# Patient Record
Sex: Female | Born: 1950 | Race: White | Hispanic: No | Smoking: Never smoker
Health system: Southern US, Community
[De-identification: ages and names within clinical notes are randomized; demographics above are authoritative.]

## PROBLEM LIST (undated history)

## (undated) DIAGNOSIS — C801 Malignant (primary) neoplasm, unspecified: Secondary | ICD-10-CM

## (undated) DIAGNOSIS — N8110 Cystocele, unspecified: Secondary | ICD-10-CM

## (undated) DIAGNOSIS — K219 Gastro-esophageal reflux disease without esophagitis: Secondary | ICD-10-CM

## (undated) DIAGNOSIS — M810 Age-related osteoporosis without current pathological fracture: Secondary | ICD-10-CM

## (undated) HISTORY — DX: Age-related osteoporosis without current pathological fracture: M81.0

## (undated) HISTORY — DX: Malignant (primary) neoplasm, unspecified: C80.1

## (undated) HISTORY — PX: COLON RESECTION: SHX5231

## (undated) HISTORY — DX: Gastro-esophageal reflux disease without esophagitis: K21.9

## (undated) HISTORY — PX: APPENDECTOMY: SHX54

## (undated) HISTORY — PX: ABDOMINAL HYSTERECTOMY: SHX81

## (undated) HISTORY — PX: CHOLECYSTECTOMY: SHX55

---

## 2010-07-24 HISTORY — PX: PANCREATICODUODENECTOMY: SUR1000

## 2013-03-17 ENCOUNTER — Encounter: Payer: Self-pay | Admitting: Sports Medicine

## 2013-04-12 ENCOUNTER — Encounter: Payer: Self-pay | Admitting: Sports Medicine

## 2013-05-12 ENCOUNTER — Encounter: Payer: Self-pay | Admitting: Sports Medicine

## 2013-05-12 ENCOUNTER — Ambulatory Visit (INDEPENDENT_AMBULATORY_CARE_PROVIDER_SITE_OTHER): Payer: BC Managed Care – PPO | Admitting: Sports Medicine

## 2013-05-12 VITALS — BP 104/69 | Ht 65.0 in | Wt 120.0 lb

## 2013-05-12 DIAGNOSIS — M21619 Bunion of unspecified foot: Secondary | ICD-10-CM

## 2013-05-12 DIAGNOSIS — M21611 Bunion of right foot: Secondary | ICD-10-CM | POA: Insufficient documentation

## 2013-05-12 DIAGNOSIS — M216X9 Other acquired deformities of unspecified foot: Secondary | ICD-10-CM | POA: Insufficient documentation

## 2013-05-12 NOTE — Progress Notes (Signed)
Patient ID: Belinda Nielsen, female   DOB: 01-08-1951, 62 y.o.   MRN: [id number] 62 yo female presents for evaluation of orthotics and need of new pair as her prior orthotics have worn out.  History of arch pain with walking and excessive pronation which was corrected with custom orthotics.  These have become worn and are not functioning as well as in past.  PMH/PSH:  Noncontributory  NKDA  SH:  Non smoker  ROS:  As per HPI, otherwise negative.  Examination: BP 104/69  Ht 5\' 5"  (1.651 m)  Wt 120 lb (54.432 kg)  BMI 19.97 kg/m2 Well developed/well nourished 62 white female A&Ox3  Feet: Midfoot breakdown with arch collapse and mild pronation. Left foot with splaying between medial and lateral column Right medial column breakdown Bunion deformity right with mild on left.  Patient was fitted for a :  semi-rigid orthotic. The orthotic was heated and afterward the patient stood on the orthotic blank positioned on the orthotic stand. The patient was positioned in subtalar neutral position and 10 degrees of ankle dorsiflexion in a weight bearing stance. After completion of molding, a stable base was applied to the orthotic blank. The blank was ground to a stable position for weight bearing. Size:  8 Base:  EVA Posting:  None Additional orthotic padding:  1st MCP joint bilaterally.  Greater than 40 minutes of time spent obtaining history, examination and production of orthotics.

## 2013-11-05 ENCOUNTER — Encounter (HOSPITAL_COMMUNITY): Payer: Self-pay | Admitting: Emergency Medicine

## 2013-11-05 ENCOUNTER — Emergency Department (HOSPITAL_COMMUNITY): Payer: BC Managed Care – PPO

## 2013-11-05 ENCOUNTER — Inpatient Hospital Stay (HOSPITAL_COMMUNITY)
Admission: EM | Admit: 2013-11-05 | Discharge: 2013-11-12 | DRG: 327 | Disposition: A | Discharge status: Home / Self Care | Payer: BC Managed Care – PPO | Attending: Surgery | Admitting: Surgery

## 2013-11-05 ENCOUNTER — Encounter (HOSPITAL_COMMUNITY): Admission: EM | Disposition: A | Discharge status: Home / Self Care | Payer: Self-pay | Source: Home / Self Care

## 2013-11-05 ENCOUNTER — Emergency Department (HOSPITAL_COMMUNITY): Payer: BC Managed Care – PPO | Admitting: Certified Registered"

## 2013-11-05 ENCOUNTER — Encounter (HOSPITAL_COMMUNITY): Payer: BC Managed Care – PPO | Admitting: Certified Registered"

## 2013-11-05 DIAGNOSIS — K560 Paralytic ileus: Secondary | ICD-10-CM | POA: Diagnosis not present

## 2013-11-05 DIAGNOSIS — K9423 Gastrostomy malfunction: Principal | ICD-10-CM | POA: Diagnosis present

## 2013-11-05 DIAGNOSIS — R198 Other specified symptoms and signs involving the digestive system and abdomen: Secondary | ICD-10-CM | POA: Diagnosis present

## 2013-11-05 DIAGNOSIS — K559 Vascular disorder of intestine, unspecified: Secondary | ICD-10-CM

## 2013-11-05 DIAGNOSIS — K289 Gastrojejunal ulcer, unspecified as acute or chronic, without hemorrhage or perforation: Secondary | ICD-10-CM | POA: Diagnosis present

## 2013-11-05 DIAGNOSIS — R109 Unspecified abdominal pain: Secondary | ICD-10-CM

## 2013-11-05 DIAGNOSIS — K668 Other specified disorders of peritoneum: Secondary | ICD-10-CM

## 2013-11-05 DIAGNOSIS — K660 Peritoneal adhesions (postprocedural) (postinfection): Secondary | ICD-10-CM | POA: Diagnosis present

## 2013-11-05 DIAGNOSIS — Y838 Other surgical procedures as the cause of abnormal reaction of the patient, or of later complication, without mention of misadventure at the time of the procedure: Secondary | ICD-10-CM | POA: Diagnosis present

## 2013-11-05 DIAGNOSIS — IMO0002 Reserved for concepts with insufficient information to code with codable children: Secondary | ICD-10-CM | POA: Diagnosis present

## 2013-11-05 DIAGNOSIS — Z8509 Personal history of malignant neoplasm of other digestive organs: Secondary | ICD-10-CM

## 2013-11-05 DIAGNOSIS — Z9049 Acquired absence of other specified parts of digestive tract: Secondary | ICD-10-CM

## 2013-11-05 DIAGNOSIS — R188 Other ascites: Secondary | ICD-10-CM | POA: Diagnosis present

## 2013-11-05 DIAGNOSIS — Z9089 Acquired absence of other organs: Secondary | ICD-10-CM

## 2013-11-05 DIAGNOSIS — K9429 Other complications of gastrostomy: Secondary | ICD-10-CM | POA: Diagnosis present

## 2013-11-05 DIAGNOSIS — Z980 Intestinal bypass and anastomosis status: Secondary | ICD-10-CM

## 2013-11-05 DIAGNOSIS — D3A8 Other benign neuroendocrine tumors: Secondary | ICD-10-CM | POA: Diagnosis present

## 2013-11-05 DIAGNOSIS — K929 Disease of digestive system, unspecified: Secondary | ICD-10-CM | POA: Diagnosis not present

## 2013-11-05 DIAGNOSIS — E876 Hypokalemia: Secondary | ICD-10-CM | POA: Diagnosis present

## 2013-11-05 DIAGNOSIS — K631 Perforation of intestine (nontraumatic): Secondary | ICD-10-CM

## 2013-11-05 DIAGNOSIS — Z90411 Acquired partial absence of pancreas: Secondary | ICD-10-CM

## 2013-11-05 HISTORY — DX: Cystocele, unspecified: N81.10

## 2013-11-05 HISTORY — PX: LAPAROTOMY: SHX154

## 2013-11-05 HISTORY — PX: LYSIS OF ADHESION: SHX5961

## 2013-11-05 LAB — CBC WITH DIFFERENTIAL/PLATELET
BASOS PCT: 0 % (ref 0–1)
Basophils Absolute: 0 10*3/uL (ref 0.0–0.1)
EOS ABS: 0 10*3/uL (ref 0.0–0.7)
EOS PCT: 0 % (ref 0–5)
HCT: 44.1 % (ref 36.0–46.0)
HEMOGLOBIN: 14.8 g/dL (ref 12.0–15.0)
Lymphocytes Relative: 5 % — ABNORMAL LOW (ref 12–46)
Lymphs Abs: 1 10*3/uL (ref 0.7–4.0)
MCH: 29.4 pg (ref 26.0–34.0)
MCHC: 33.6 g/dL (ref 30.0–36.0)
MCV: 87.5 fL (ref 78.0–100.0)
Monocytes Absolute: 0.7 10*3/uL (ref 0.1–1.0)
Monocytes Relative: 4 % (ref 3–12)
NEUTROS PCT: 91 % — AB (ref 43–77)
Neutro Abs: 16.8 10*3/uL — ABNORMAL HIGH (ref 1.7–7.7)
Platelets: 351 10*3/uL (ref 150–400)
RBC: 5.04 MIL/uL (ref 3.87–5.11)
RDW: 13 % (ref 11.5–15.5)
WBC: 18.5 10*3/uL — ABNORMAL HIGH (ref 4.0–10.5)

## 2013-11-05 LAB — COMPREHENSIVE METABOLIC PANEL
ALBUMIN: 3.5 g/dL (ref 3.5–5.2)
ALT: 21 U/L (ref 0–35)
AST: 18 U/L (ref 0–37)
Alkaline Phosphatase: 78 U/L (ref 39–117)
BUN: 8 mg/dL (ref 6–23)
CALCIUM: 8.8 mg/dL (ref 8.4–10.5)
CO2: 24 mEq/L (ref 19–32)
CREATININE: 0.44 mg/dL — AB (ref 0.50–1.10)
Chloride: 102 mEq/L (ref 96–112)
GFR calc non Af Amer: >90 mL/min (ref >90)
Glucose, Bld: 154 mg/dL — ABNORMAL HIGH (ref 70–99)
Potassium: 3.2 mEq/L — ABNORMAL LOW (ref 3.7–5.3)
Sodium: 141 mEq/L (ref 137–147)
Total Bilirubin: 0.4 mg/dL (ref 0.3–1.2)
Total Protein: 6.2 g/dL (ref 6.0–8.3)

## 2013-11-05 LAB — URINALYSIS, ROUTINE W REFLEX MICROSCOPIC
Bilirubin Urine: NEGATIVE
Glucose, UA: NEGATIVE mg/dL
Hgb urine dipstick: NEGATIVE
Ketones, ur: 40 mg/dL — AB
LEUKOCYTES UA: NEGATIVE
Nitrite: NEGATIVE
PH: 6 (ref 5.0–8.0)
Protein, ur: NEGATIVE mg/dL
Specific Gravity, Urine: 1.026 (ref 1.005–1.030)
Urobilinogen, UA: 0.2 mg/dL (ref 0.0–1.0)

## 2013-11-05 LAB — LACTIC ACID, PLASMA: Lactic Acid, Venous: 1.5 mmol/L (ref 0.5–2.2)

## 2013-11-05 LAB — TROPONIN I

## 2013-11-05 LAB — LIPASE, BLOOD: Lipase: 17 U/L (ref 11–59)

## 2013-11-05 SURGERY — LAPAROTOMY, EXPLORATORY
Anesthesia: General

## 2013-11-05 MED ORDER — HYDROMORPHONE HCL PF 1 MG/ML IJ SOLN
1.0000 mg | Freq: Once | INTRAMUSCULAR | Status: AC
  Administered 2013-11-05: 1 mg via INTRAVENOUS
  Filled 2013-11-05: qty 1

## 2013-11-05 MED ORDER — SODIUM CHLORIDE 0.9 % IR SOLN
Status: DC | PRN
  Administered 2013-11-05 (×4): 1000 mL

## 2013-11-05 MED ORDER — ROCURONIUM BROMIDE 100 MG/10ML IV SOLN
INTRAVENOUS | Status: AC
  Filled 2013-11-05: qty 1

## 2013-11-05 MED ORDER — SUCCINYLCHOLINE CHLORIDE 20 MG/ML IJ SOLN
INTRAMUSCULAR | Status: DC | PRN
  Administered 2013-11-05: 80 mg via INTRAVENOUS

## 2013-11-05 MED ORDER — SODIUM CHLORIDE 0.9 % IV BOLUS (SEPSIS)
1000.0000 mL | Freq: Once | INTRAVENOUS | Status: AC
  Administered 2013-11-05: 1000 mL via INTRAVENOUS

## 2013-11-05 MED ORDER — ACETAMINOPHEN 10 MG/ML IV SOLN
INTRAVENOUS | Status: DC | PRN
  Administered 2013-11-05: 1000 mg via INTRAVENOUS

## 2013-11-05 MED ORDER — FENTANYL CITRATE 0.05 MG/ML IJ SOLN
INTRAMUSCULAR | Status: AC
  Filled 2013-11-05: qty 5

## 2013-11-05 MED ORDER — MIDAZOLAM HCL 5 MG/5ML IJ SOLN
INTRAMUSCULAR | Status: DC | PRN
  Administered 2013-11-05: 2 mg via INTRAVENOUS

## 2013-11-05 MED ORDER — SCOPOLAMINE 1 MG/3DAYS TD PT72
MEDICATED_PATCH | TRANSDERMAL | Status: AC
  Filled 2013-11-05: qty 1

## 2013-11-05 MED ORDER — SCOPOLAMINE 1 MG/3DAYS TD PT72
MEDICATED_PATCH | TRANSDERMAL | Status: DC | PRN
  Administered 2013-11-05: 1 via TRANSDERMAL

## 2013-11-05 MED ORDER — PROPOFOL 10 MG/ML IV BOLUS
INTRAVENOUS | Status: AC
  Filled 2013-11-05: qty 20

## 2013-11-05 MED ORDER — ONDANSETRON HCL 4 MG/2ML IJ SOLN
INTRAMUSCULAR | Status: AC
  Filled 2013-11-05: qty 2

## 2013-11-05 MED ORDER — ONDANSETRON HCL 4 MG/2ML IJ SOLN
INTRAMUSCULAR | Status: DC | PRN
  Administered 2013-11-05: 4 mg via INTRAVENOUS

## 2013-11-05 MED ORDER — MIDAZOLAM HCL 2 MG/2ML IJ SOLN
INTRAMUSCULAR | Status: AC
  Filled 2013-11-05: qty 2

## 2013-11-05 MED ORDER — DEXAMETHASONE SODIUM PHOSPHATE 10 MG/ML IJ SOLN
INTRAMUSCULAR | Status: DC | PRN
  Administered 2013-11-05: 10 mg via INTRAVENOUS

## 2013-11-05 MED ORDER — DEXAMETHASONE SODIUM PHOSPHATE 10 MG/ML IJ SOLN
INTRAMUSCULAR | Status: AC
  Filled 2013-11-05: qty 1

## 2013-11-05 MED ORDER — LIDOCAINE HCL (PF) 2 % IJ SOLN
INTRAMUSCULAR | Status: DC | PRN
  Administered 2013-11-05: 40 mg via INTRADERMAL

## 2013-11-05 MED ORDER — SODIUM CHLORIDE 0.9 % IV SOLN
1.0000 g | INTRAVENOUS | Status: AC
  Administered 2013-11-05: 1 g via INTRAVENOUS
  Filled 2013-11-05: qty 1

## 2013-11-05 MED ORDER — IOHEXOL 300 MG/ML  SOLN
100.0000 mL | Freq: Once | INTRAMUSCULAR | Status: AC | PRN
  Administered 2013-11-05: 100 mL via INTRAVENOUS

## 2013-11-05 MED ORDER — FENTANYL CITRATE 0.05 MG/ML IJ SOLN
INTRAMUSCULAR | Status: DC | PRN
  Administered 2013-11-05: 100 ug via INTRAVENOUS
  Administered 2013-11-05 (×3): 50 ug via INTRAVENOUS

## 2013-11-05 MED ORDER — ACETAMINOPHEN 10 MG/ML IV SOLN
1000.0000 mg | Freq: Four times a day (QID) | INTRAVENOUS | Status: DC
Start: 2013-11-06 — End: 2013-11-06
  Filled 2013-11-05: qty 100

## 2013-11-05 MED ORDER — HYDROMORPHONE HCL PF 2 MG/ML IJ SOLN
INTRAMUSCULAR | Status: AC
  Filled 2013-11-05: qty 1

## 2013-11-05 MED ORDER — ERTAPENEM SODIUM 1 G IJ SOLR
INTRAMUSCULAR | Status: AC
  Filled 2013-11-05: qty 1

## 2013-11-05 MED ORDER — IOHEXOL 300 MG/ML  SOLN
50.0000 mL | Freq: Once | INTRAMUSCULAR | Status: AC | PRN
  Administered 2013-11-05: 50 mL via ORAL

## 2013-11-05 MED ORDER — ONDANSETRON HCL 4 MG/2ML IJ SOLN
4.0000 mg | Freq: Once | INTRAMUSCULAR | Status: AC
  Administered 2013-11-05: 4 mg via INTRAVENOUS
  Filled 2013-11-05: qty 2

## 2013-11-05 MED ORDER — PROPOFOL 10 MG/ML IV BOLUS
INTRAVENOUS | Status: DC | PRN
  Administered 2013-11-05: 100 mg via INTRAVENOUS

## 2013-11-05 MED ORDER — ROCURONIUM BROMIDE 100 MG/10ML IV SOLN
INTRAVENOUS | Status: DC | PRN
Start: 2013-11-05 — End: 2013-11-06
  Administered 2013-11-05 (×2): 10 mg via INTRAVENOUS
  Administered 2013-11-05: 20 mg via INTRAVENOUS

## 2013-11-05 MED ORDER — LACTATED RINGERS IV SOLN
INTRAVENOUS | Status: DC | PRN
  Administered 2013-11-05: 22:00:00 via INTRAVENOUS

## 2013-11-05 MED ORDER — HYDROMORPHONE HCL PF 1 MG/ML IJ SOLN
INTRAMUSCULAR | Status: DC | PRN
  Administered 2013-11-05 – 2013-11-06 (×2): 1 mg via INTRAVENOUS

## 2013-11-05 SURGICAL SUPPLY — 43 items
APPLICATOR COTTON TIP 6IN STRL (MISCELLANEOUS) ×2 IMPLANT
BLADE EXTENDED COATED 6.5IN (ELECTRODE) IMPLANT
BLADE HEX COATED 2.75 (ELECTRODE) ×2 IMPLANT
CANISTER SUCTION 2500CC (MISCELLANEOUS) ×2 IMPLANT
COVER MAYO STAND STRL (DRAPES) ×2 IMPLANT
DRAPE LAPAROSCOPIC ABDOMINAL (DRAPES) ×2 IMPLANT
DRAPE WARM FLUID 44X44 (DRAPE) ×2 IMPLANT
ELECT REM PT RETURN 9FT ADLT (ELECTROSURGICAL) ×2
ELECTRODE REM PT RTRN 9FT ADLT (ELECTROSURGICAL) ×1 IMPLANT
GLOVE BIOGEL PI IND STRL 7.0 (GLOVE) ×2 IMPLANT
GLOVE BIOGEL PI IND STRL 8.5 (GLOVE) ×2 IMPLANT
GLOVE BIOGEL PI INDICATOR 7.0 (GLOVE) ×2
GLOVE BIOGEL PI INDICATOR 8.5 (GLOVE) ×2
GLOVE SURG ORTHO 8.0 STRL STRW (GLOVE) ×2 IMPLANT
GOWN STRL REUS W/TWL LRG LVL3 (GOWN DISPOSABLE) ×2 IMPLANT
GOWN STRL REUS W/TWL XL LVL3 (GOWN DISPOSABLE) ×4 IMPLANT
KIT BASIN OR (CUSTOM PROCEDURE TRAY) ×2 IMPLANT
LIGASURE IMPACT 36 18CM CVD LR (INSTRUMENTS) ×2 IMPLANT
NS IRRIG 1000ML POUR BTL (IV SOLUTION) ×2 IMPLANT
PACK GENERAL/GYN (CUSTOM PROCEDURE TRAY) ×2 IMPLANT
RELOAD PROXIMATE 75MM BLUE (ENDOMECHANICALS) ×6 IMPLANT
RELOAD PROXIMATE TA60MM GREEN (ENDOMECHANICALS) ×4 IMPLANT
SPONGE GAUZE 4X4 12PLY (GAUZE/BANDAGES/DRESSINGS) ×2 IMPLANT
SPONGE LAP 18X18 X RAY DECT (DISPOSABLE) ×2 IMPLANT
STAPLER GUN LINEAR PROX 60 (STAPLE) ×2 IMPLANT
STAPLER PROXIMATE 75MM BLUE (STAPLE) ×2 IMPLANT
STAPLER VISISTAT 35W (STAPLE) ×4 IMPLANT
SUCTION POOLE TIP (SUCTIONS) ×2 IMPLANT
SUT NOV 1 T60/GS (SUTURE) IMPLANT
SUT NOVA 0 T19/GS 22DT (SUTURE) ×4 IMPLANT
SUT SILK 2 0 (SUTURE) ×1
SUT SILK 2 0 SH CR/8 (SUTURE) ×6 IMPLANT
SUT SILK 2-0 18XBRD TIE 12 (SUTURE) ×1 IMPLANT
SUT SILK 3 0 (SUTURE) ×1
SUT SILK 3 0 SH CR/8 (SUTURE) ×2 IMPLANT
SUT SILK 3-0 18XBRD TIE 12 (SUTURE) ×1 IMPLANT
SUT VICRYL 2 0 18  UND BR (SUTURE)
SUT VICRYL 2 0 18 UND BR (SUTURE) IMPLANT
TAPE CLOTH SURG 4X10 WHT LF (GAUZE/BANDAGES/DRESSINGS) ×2 IMPLANT
TOWEL OR 17X26 10 PK STRL BLUE (TOWEL DISPOSABLE) ×4 IMPLANT
TRAY FOLEY CATH 14FRSI W/METER (CATHETERS) ×2 IMPLANT
WATER STERILE IRR 1500ML POUR (IV SOLUTION) ×2 IMPLANT
YANKAUER SUCT BULB TIP NO VENT (SUCTIONS) IMPLANT

## 2013-11-05 NOTE — ED Notes (Signed)
Per EMS: Generalized abd pain onset 2 hrs ago. Hx of prolapsed bladder. Pt states pain is sharp, 10/10, and feels like gas. Pt says pain feels like pain she's had in the past but worse. Hx of pancreatic cancer, had surgery for removal. Denies N/V/D. Pt appears diaphoretic and pale. A&O x 4. Pt ambulatory at baseline, couldn't walk today d/t pain. 50 mcg Fentanyl given IM.

## 2013-11-05 NOTE — ED Notes (Signed)
Couldn't obtain oral temp x2. Stated we would have to get a rectal temp and Pt refused.

## 2013-11-05 NOTE — ED Provider Notes (Signed)
Medical screening examination/treatment/procedure(s) were conducted as a shared visit with non-physician practitioner(s) and myself.  I personally evaluated the patient during the encounter.   EKG Interpretation   Date/Time:  Saturday Nov 05 2013 18:56:44 EDT Ventricular Rate:  80 PR Interval:  179 QRS Duration: 100 QT Interval:  416 QTC Calculation: 480 R Axis:   30 Text Interpretation:  Sinus rhythm No previous ECGs available Confirmed by  YAO  MD, DAVID ([id number]) on 11/05/2013 6:59:09 PM     Acute pain with generalized peritonitis on exam.  Babette Relic, MD 11/09/13 2208

## 2013-11-05 NOTE — ED Notes (Signed)
Asked Pt to void and she said she was unable to. I made her aware that we would have to I/O her if she couldn't in about 30 mins.

## 2013-11-05 NOTE — ED Provider Notes (Signed)
CSN: [id number]     Arrival date & time 11/05/13  1625 History   First MD Initiated Contact with Patient 11/05/13 1628     Chief Complaint  Patient presents with  . Abdominal Pain     (Consider location/radiation/quality/duration/timing/severity/associated sxs/prior Treatment) HPI Comments: Patient presents to the ED with a chief complain of abdominal pain.  She states that the pain started this morning.  She states that she has had pain like this before in the past, but this is worse.  She states that the pain is constant.  She endorses associated nausea, but denies any vomiting.  She reports having loose stools yesterday after taking a laxative.  She denies any melena.  There are no aggravating or alleviating factors.  Pain is 10/10.  PSH of cholecystectomy, appendectomy, colon resection and whipple procedure for pancreatic cancer.  The history is provided by the patient. No language interpreter was used.    Past Medical History  Diagnosis Date  . Pancreatic cancer   . Bladder prolapse, female, acquired    Past Surgical History  Procedure Laterality Date  . Colon resection    . Cholecystectomy    . Appendectomy    . Abdominal hysterectomy     No family history on file. History  Substance Use Topics  . Smoking status: Never Smoker   . Smokeless tobacco: Not on file  . Alcohol Use: Yes     Comment: rarely   OB History   Grav Para Term Preterm Abortions TAB SAB Ect Mult Living                 Review of Systems  Constitutional: Negative for fever and chills.  Respiratory: Negative for shortness of breath.   Cardiovascular: Negative for chest pain.  Gastrointestinal: Positive for nausea and abdominal pain. Negative for vomiting, diarrhea and constipation.  Genitourinary: Negative for dysuria.      Allergies  Review of patient's allergies indicates no known allergies.  Home Medications   Prior to Admission medications   Medication Sig Start Date End Date Taking?  Authorizing Provider  estrogens, conjugated, (PREMARIN) 0.625 MG tablet Take 0.625 mg by mouth daily. Take daily for 21 days then do not take for 7 days.   Yes Historical Provider, MD  ibuprofen (ADVIL,MOTRIN) 200 MG tablet Take 200 mg by mouth every 6 (six) hours as needed (pain).   Yes Historical Provider, MD  Multiple Vitamins-Minerals (MULTIVITAMIN WITH MINERALS) tablet Take 1 tablet by mouth daily.   Yes Historical Provider, MD   BP 118/53  Pulse 76  Resp 18  SpO2 96% Physical Exam  Nursing note and vitals reviewed. Constitutional: She is oriented to person, place, and time. She appears well-developed and well-nourished.  HENT:  Head: Normocephalic and atraumatic.  Eyes: Conjunctivae and EOM are normal. Pupils are equal, round, and reactive to light.  Neck: Normal range of motion. Neck supple.  Cardiovascular: Normal rate and regular rhythm.  Exam reveals no gallop and no friction rub.   No murmur heard. Pulmonary/Chest: Effort normal and breath sounds normal. No respiratory distress. She has no wheezes. She has no rales. She exhibits no tenderness.  Abdominal: Soft. Bowel sounds are normal. She exhibits no distension and no mass. There is tenderness. There is guarding. There is no rebound.  Diffuse abdominal tenderness and guarding   Musculoskeletal: Normal range of motion. She exhibits no edema and no tenderness.  Neurological: She is alert and oriented to person, place, and time.  Skin: Skin is  warm and dry.  Psychiatric: She has a normal mood and affect. Her behavior is normal. Judgment and thought content normal.    ED Course  Procedures (including critical care time) Results for orders placed during the hospital encounter of 11/05/13  CBC WITH DIFFERENTIAL      Result Value Ref Range   WBC 18.5 (*) 4.0 - 10.5 K/uL   RBC 5.04  3.87 - 5.11 MIL/uL   Hemoglobin 14.8  12.0 - 15.0 g/dL   HCT 44.1  36.0 - 46.0 %   MCV 87.5  78.0 - 100.0 fL   MCH 29.4  26.0 - 34.0 pg   MCHC  33.6  30.0 - 36.0 g/dL   RDW 13.0  11.5 - 15.5 %   Platelets 351  150 - 400 K/uL   Neutrophils Relative % 91 (*) 43 - 77 %   Neutro Abs 16.8 (*) 1.7 - 7.7 K/uL   Lymphocytes Relative 5 (*) 12 - 46 %   Lymphs Abs 1.0  0.7 - 4.0 K/uL   Monocytes Relative 4  3 - 12 %   Monocytes Absolute 0.7  0.1 - 1.0 K/uL   Eosinophils Relative 0  0 - 5 %   Eosinophils Absolute 0.0  0.0 - 0.7 K/uL   Basophils Relative 0  0 - 1 %   Basophils Absolute 0.0  0.0 - 0.1 K/uL  COMPREHENSIVE METABOLIC PANEL      Result Value Ref Range   Sodium 141  137 - 147 mEq/L   Potassium 3.2 (*) 3.7 - 5.3 mEq/L   Chloride 102  96 - 112 mEq/L   CO2 24  19 - 32 mEq/L   Glucose, Bld 154 (*) 70 - 99 mg/dL   BUN 8  6 - 23 mg/dL   Creatinine, Ser 0.44 (*) 0.50 - 1.10 mg/dL   Calcium 8.8  8.4 - 10.5 mg/dL   Total Protein 6.2  6.0 - 8.3 g/dL   Albumin 3.5  3.5 - 5.2 g/dL   AST 18  0 - 37 U/L   ALT 21  0 - 35 U/L   Alkaline Phosphatase 78  39 - 117 U/L   Total Bilirubin 0.4  0.3 - 1.2 mg/dL   GFR calc non Af Amer >90  >90 mL/min   GFR calc Af Amer >90  >90 mL/min  LIPASE, BLOOD      Result Value Ref Range   Lipase 17  11 - 59 U/L  URINALYSIS, ROUTINE W REFLEX MICROSCOPIC      Result Value Ref Range   Color, Urine YELLOW  YELLOW   APPearance CLEAR  CLEAR   Specific Gravity, Urine 1.026  1.005 - 1.030   pH 6.0  5.0 - 8.0   Glucose, UA NEGATIVE  NEGATIVE mg/dL   Hgb urine dipstick NEGATIVE  NEGATIVE   Bilirubin Urine NEGATIVE  NEGATIVE   Ketones, ur 40 (*) NEGATIVE mg/dL   Protein, ur NEGATIVE  NEGATIVE mg/dL   Urobilinogen, UA 0.2  0.0 - 1.0 mg/dL   Nitrite NEGATIVE  NEGATIVE   Leukocytes, UA NEGATIVE  NEGATIVE  LACTIC ACID, PLASMA      Result Value Ref Range   Lactic Acid, Venous 1.5  0.5 - 2.2 mmol/L  TROPONIN I      Result Value Ref Range   Troponin I <0.30  <0.30 ng/mL   Ct Abdomen Pelvis W Contrast  11/05/2013   CLINICAL DATA:  Pain. History pancreatic cancer. Prior history of Whipple surgery.  Appendectomy, hysterectomy cul colon resection, cholecystectomy.  EXAM: CT ABDOMEN AND PELVIS WITH CONTRAST  TECHNIQUE: Multidetector CT imaging of the abdomen and pelvis was performed using the standard protocol following bolus administration of intravenous contrast.  CONTRAST:  81mL OMNIPAQUE IOHEXOL 300 MG/ML SOLN, 133mL OMNIPAQUE IOHEXOL 300 MG/ML SOLN  COMPARISON:  None.  FINDINGS: Multiple small hepatic cysts are noted. Liver is otherwise unremarkable. Spleen is normal. Pancreas unremarkable. Surgical clips in the gallbladder fossa. No biliary distention.  Adrenals are unremarkable. Simple renal cysts are present. No hydronephrosis. Mild bladder distention. Hysterectomy. No pelvic mass. Mild ascites.  No adenopathy. The abdominal aorta is widely patent. Visceral vessels are patent. Portal vein and splenic vein patent. Hepatic veins patent.  Free intraperitoneal air is noted. Thickened loops of small bowel with proximal small bowel distention noted. Distal bowel is nondistended. The colon is nondistended. Small bowel obstruction cannot be excluded. Small bowel inflammation and/or ischemia may be present. Given free air, small bowel perforation may be present. Postsurgical changes noted in the of rectosigmoid region. Stool is present in the colon.  Heart size normal. Basilar atelectasis and/or mild infiltrates. Small pleural effusions. Degenerative changes lumbar spine with severe scoliosis concave right. Degenerative changes both hips.  IMPRESSION: 1. Distended loops of proximal small bowel with severe bowel wall thickening. This suggests ischemia and/or inflammatory bowel. Proximal small bowel obstruction cannot be excluded .  2. Free intraperitoneal air. Bowel perforation could present in this fashion.  3. Ascites.  These results were called by telephone at the time of interpretation on 11/05/2013 at 6:54 PM to P.A. Montine Circle , who verbally acknowledged these results.   Electronically Signed   By: Marcello Moores   Register   On: 11/05/2013 18:55      EKG Interpretation   Date/Time:  Saturday Nov 05 2013 18:56:44 EDT Ventricular Rate:  80 PR Interval:  179 QRS Duration: 100 QT Interval:  416 QTC Calculation: 480 R Axis:   30 Text Interpretation:  Sinus rhythm No previous ECGs available Confirmed by  YAO  MD, DAVID ([id number]) on 11/05/2013 6:59:09 PM      MDM   Final diagnoses:  Pneumoperitoneum  Ischemic bowel disease   Patient with abdominal pain.  Started acutely today. She has extensive abdominal surgical history. History of pancreatic cancer. Will check labs, and CT scan.  CT shows possible ischemic bowel, with pneumoperitoneum.  Medications  HYDROmorphone (DILAUDID) injection 1 mg (1 mg Intravenous Given 11/05/13 1703)  ondansetron (ZOFRAN) injection 4 mg (4 mg Intravenous Given 11/05/13 1703)  sodium chloride 0.9 % bolus 1,000 mL (0 mLs Intravenous Stopped 11/05/13 1803)  iohexol (OMNIPAQUE) 300 MG/ML solution 50 mL (50 mLs Oral Contrast Given 11/05/13 1715)  iohexol (OMNIPAQUE) 300 MG/ML solution 100 mL (100 mLs Intravenous Contrast Given 11/05/13 1809)  HYDROmorphone (DILAUDID) injection 1 mg (1 mg Intravenous Given 11/05/13 1929)     CRITICAL CARE Performed by: Montine Circle   Total critical care time: 38  Critical care time was exclusive of separately billable procedures and treating other patients.  Critical care was necessary to treat or prevent imminent or life-threatening deterioration.  Critical care was time spent personally by me on the following activities: development of treatment plan with patient and/or surrogate as well as nursing, discussions with consultants, evaluation of patient's response to treatment, examination of patient, obtaining history from patient or surrogate, ordering and performing treatments and interventions, ordering and review of laboratory studies, ordering and review of radiographic studies, pulse oximetry and re-evaluation of patient's  condition.   Patient seen by and discussed with Dr. Stevie Kern.  Will consult general surgery.  Patient discussed with Dr. Harlow Asa, who will admit and take the patient to surgery.    Montine Circle, PA-C 11/05/13 2059

## 2013-11-05 NOTE — H&P (Signed)
Belinda Nielsen is an 63 y.o. female.    General Surgery [facility]  Chief Complaint:  Abdominal pain  HPI: patient is a 63 yo WF who presents to the ER with sudden onset of abdominal pain.  Pain is diffuse, severe, worse with movement.  No BM today.  Takes laxatives.  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 Candis Shine.  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  History of cholecystectomy in [facility].  Denies other medical problems.  Denies prescription medications.  Works for [employer].  WBC elevated at 18K with left shift.  CT abdomen shows free air consistent with perforated viscus.  Ascites is present.  Small bowel loops appear thickened and possibly ischemic.  Past Medical History  Diagnosis Date  . Pancreatic cancer   . Bladder prolapse, female, acquired     Past Surgical History  Procedure Laterality Date  . Colon resection    . Cholecystectomy    . Appendectomy    . Abdominal hysterectomy      No family history on file. Social History:  reports that she has never smoked. She does not have any smokeless tobacco history on file. She reports that she drinks alcohol. Her drug history is not on file.  Allergies: No Known Allergies   (Not in a hospital admission)  Results for orders placed during the hospital encounter of 11/05/13 (from the past 48 hour(s))  CBC WITH DIFFERENTIAL     Status: Abnormal   Collection Time    11/05/13  5:17 PM      Result Value Ref Range   WBC 18.5 (*) 4.0 - 10.5 K/uL   RBC 5.04  3.87 - 5.11 MIL/uL   Hemoglobin 14.8  12.0 - 15.0 g/dL   HCT 44.1  36.0 - 46.0 %   MCV 87.5  78.0 - 100.0 fL   MCH 29.4  26.0 - 34.0 pg   MCHC 33.6  30.0 - 36.0 g/dL   RDW 13.0  11.5 - 15.5 %   Platelets 351  150 - 400 K/uL   Neutrophils Relative % 91 (*) 43 - 77 %   Neutro Abs 16.8 (*) 1.7 - 7.7 K/uL   Lymphocytes Relative 5 (*) 12 - 46 %   Lymphs Abs 1.0  0.7 - 4.0  K/uL   Monocytes Relative 4  3 - 12 %   Monocytes Absolute 0.7  0.1 - 1.0 K/uL   Eosinophils Relative 0  0 - 5 %   Eosinophils Absolute 0.0  0.0 - 0.7 K/uL   Basophils Relative 0  0 - 1 %   Basophils Absolute 0.0  0.0 - 0.1 K/uL  COMPREHENSIVE METABOLIC PANEL     Status: Abnormal   Collection Time    11/05/13  5:17 PM      Result Value Ref Range   Sodium 141  137 - 147 mEq/L   Potassium 3.2 (*) 3.7 - 5.3 mEq/L   Chloride 102  96 - 112 mEq/L   CO2 24  19 - 32 mEq/L   Glucose, Bld 154 (*) 70 - 99 mg/dL   BUN 8  6 - 23 mg/dL   Creatinine, Ser 0.44 (*) 0.50 - 1.10 mg/dL   Calcium 8.8  8.4 - 10.5 mg/dL   Total Protein 6.2  6.0 - 8.3 g/dL   Albumin 3.5  3.5 - 5.2 g/dL   AST 18  0 - 37 U/L  ALT 21  0 - 35 U/L   Alkaline Phosphatase 78  39 - 117 U/L   Total Bilirubin 0.4  0.3 - 1.2 mg/dL   GFR calc non Af Amer >90  >90 mL/min   GFR calc Af Amer >90  >90 mL/min   Comment: (NOTE)     The eGFR has been calculated using the CKD EPI equation.     This calculation has not been validated in all clinical situations.     eGFR's persistently <90 mL/min signify possible Chronic Kidney     Disease.  LIPASE, BLOOD     Status: None   Collection Time    11/05/13  5:17 PM      Result Value Ref Range   Lipase 17  11 - 59 U/L  TROPONIN I     Status: None   Collection Time    11/05/13  5:17 PM      Result Value Ref Range   Troponin I <0.30  <0.30 ng/mL   Comment:            Due to the release kinetics of cTnI,     a negative result within the first hours     of the onset of symptoms does not rule out     myocardial infarction with certainty.     If myocardial infarction is still suspected,     repeat the test at appropriate intervals.  LACTIC ACID, PLASMA     Status: None   Collection Time    11/05/13  5:47 PM      Result Value Ref Range   Lactic Acid, Venous 1.5  0.5 - 2.2 mmol/L  URINALYSIS, ROUTINE W REFLEX MICROSCOPIC     Status: Abnormal   Collection Time    11/05/13  7:15 PM       Result Value Ref Range   Color, Urine YELLOW  YELLOW   APPearance CLEAR  CLEAR   Specific Gravity, Urine 1.026  1.005 - 1.030   pH 6.0  5.0 - 8.0   Glucose, UA NEGATIVE  NEGATIVE mg/dL   Hgb urine dipstick NEGATIVE  NEGATIVE   Bilirubin Urine NEGATIVE  NEGATIVE   Ketones, ur 40 (*) NEGATIVE mg/dL   Protein, ur NEGATIVE  NEGATIVE mg/dL   Urobilinogen, UA 0.2  0.0 - 1.0 mg/dL   Nitrite NEGATIVE  NEGATIVE   Leukocytes, UA NEGATIVE  NEGATIVE   Comment: MICROSCOPIC NOT DONE ON URINES WITH NEGATIVE PROTEIN, BLOOD, LEUKOCYTES, NITRITE, OR GLUCOSE <1000 mg/dL.   Ct Abdomen Pelvis W Contrast  11/05/2013   CLINICAL DATA:  Pain. History pancreatic cancer. Prior history of Whipple surgery. Appendectomy, hysterectomy cul colon resection, cholecystectomy.  EXAM: CT ABDOMEN AND PELVIS WITH CONTRAST  TECHNIQUE: Multidetector CT imaging of the abdomen and pelvis was performed using the standard protocol following bolus administration of intravenous contrast.  CONTRAST:  32m OMNIPAQUE IOHEXOL 300 MG/ML SOLN, 1047mOMNIPAQUE IOHEXOL 300 MG/ML SOLN  COMPARISON:  None.  FINDINGS: Multiple small hepatic cysts are noted. Liver is otherwise unremarkable. Spleen is normal. Pancreas unremarkable. Surgical clips in the gallbladder fossa. No biliary distention.  Adrenals are unremarkable. Simple renal cysts are present. No hydronephrosis. Mild bladder distention. Hysterectomy. No pelvic mass. Mild ascites.  No adenopathy. The abdominal aorta is widely patent. Visceral vessels are patent. Portal vein and splenic vein patent. Hepatic veins patent.  Free intraperitoneal air is noted. Thickened loops of small bowel with proximal small bowel distention noted. Distal bowel is nondistended. The colon is  nondistended. Small bowel obstruction cannot be excluded. Small bowel inflammation and/or ischemia may be present. Given free air, small bowel perforation may be present. Postsurgical changes noted in the of rectosigmoid region. Stool  is present in the colon.  Heart size normal. Basilar atelectasis and/or mild infiltrates. Small pleural effusions. Degenerative changes lumbar spine with severe scoliosis concave right. Degenerative changes both hips.  IMPRESSION: 1. Distended loops of proximal small bowel with severe bowel wall thickening. This suggests ischemia and/or inflammatory bowel. Proximal small bowel obstruction cannot be excluded .  2. Free intraperitoneal air. Bowel perforation could present in this fashion.  3. Ascites.  These results were called by telephone at the time of interpretation on 11/05/2013 at 6:54 PM to P.A. Montine Circle , who verbally acknowledged these results.   Electronically Signed   By: Marcello Moores  Register   On: 11/05/2013 18:55    Review of Systems  Constitutional: Negative.   HENT: Negative.   Eyes: Negative.   Respiratory: Negative.   Cardiovascular: Negative.   Gastrointestinal: Positive for heartburn, abdominal pain and constipation.  Genitourinary: Negative.   Musculoskeletal: Negative.   Skin: Negative.   Neurological: Negative.   Endo/Heme/Allergies: Negative.   Psychiatric/Behavioral: Negative.     Blood pressure 142/66, pulse 89, temperature 97.7 F (36.5 C), temperature source Oral, resp. rate 18, SpO2 97.00%. Physical Exam  Constitutional: She is oriented to person, place, and time. She appears well-developed and well-nourished. No distress.  HENT:  Head: Normocephalic and atraumatic.  Right Ear: External ear normal.  Left Ear: External ear normal.  Eyes: Conjunctivae are normal. Pupils are equal, round, and reactive to light. No scleral icterus.  Neck: Normal range of motion. Neck supple. No thyromegaly present.  Cardiovascular: Normal rate, regular rhythm and normal heart sounds.   No murmur heard. Respiratory: Effort normal and breath sounds normal. She has no wheezes.  GI: Soft. She exhibits no distension and no mass. There is tenderness. There is rebound and guarding.   Diffuse abdominal tenderness with guarding, poorly localized; well-healed midline incisions; quiet to auscultation.  Musculoskeletal: Normal range of motion. She exhibits no edema.  Neurological: She is alert and oriented to person, place, and time.  Skin: Skin is warm and dry.  Psychiatric: She has a normal mood and affect. Her behavior is normal.     Assessment/Plan Perforated viscus, possible small bowel ischemia  Plan ex lap this evening.  Discussed possible etiology including perforated ulcer, perforated diverticular disease, ischemic or infarcted small intestine.  Discussed surgical treatment of each entity.  Discussed possible colostomy placement.  Discussed possibility of massive small bowel infarction.  The risks and benefits of the procedure have been discussed at length with the patient.  The patient understands the proposed procedure, potential alternative treatments, and the course of recovery to be expected.  All of the patient's questions have been answered at this time.  The patient wishes to proceed with surgery.  Earnstine Regal, MD, [facility] Office: [place] 11/05/2013, 9:07 PM

## 2013-11-05 NOTE — ED Notes (Signed)
Bed: WA10 Expected date: 11/05/13 Expected time: 4:12 PM Means of arrival: Ambulance Comments: abd pain

## 2013-11-05 NOTE — ED Notes (Signed)
Patient transported to CT 

## 2013-11-05 NOTE — ED Notes (Signed)
Ambulated Pt on Steady to Restroom. Still couldn't void. Asked Rob to order I/O.

## 2013-11-05 NOTE — Anesthesia Preprocedure Evaluation (Signed)
Anesthesia Evaluation  Patient identified by MRN, date of birth, ID band Patient awake  General Assessment Comment:HPI: patient is a 63 yo WF who presents to the ER with sudden onset of abdominal pain.  Pain is diffuse, severe, worse with movement.  No BM today.  Takes laxatives.   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 Candis Shine.  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  History of cholecystectomy in [facility].   Denies other medical problems.  Denies prescription medications.   Reviewed: Allergy & Precautions, H&P , NPO status , Patient's Chart, lab work & pertinent test results  Airway Mallampati: II TM Distance: >3 FB Neck ROM: Full    Dental no notable dental hx.    Pulmonary neg pulmonary ROS,  breath sounds clear to auscultation  Pulmonary exam normal       Cardiovascular negative cardio ROS  Rhythm:Regular Rate:Normal     Neuro/Psych negative neurological ROS  negative psych ROS   GI/Hepatic negative GI ROS, Neg liver ROS,   Endo/Other  negative endocrine ROS  Renal/GU negative Renal ROS  negative genitourinary   Musculoskeletal negative musculoskeletal ROS (+)   Abdominal (+)  Abdomen: rigid and tender.    Peds negative pediatric ROS (+)  Hematology negative hematology ROS (+)   Anesthesia Other Findings   Reproductive/Obstetrics negative OB ROS                           Anesthesia Physical Anesthesia Plan  ASA: III and emergent  Anesthesia Plan: General   Post-op Pain Management:    Induction: Intravenous, Rapid sequence and Cricoid pressure planned  Airway Management Planned: Oral ETT  Additional Equipment:   Intra-op Plan:   Post-operative Plan: Extubation in OR and Possible Post-op intubation/ventilation  Informed Consent: I have reviewed the patients History and Physical, chart, labs  and discussed the procedure including the risks, benefits and alternatives for the proposed anesthesia with the patient or authorized representative who has indicated his/her understanding and acceptance.   Dental advisory given  Plan Discussed with: CRNA and Surgeon  Anesthesia Plan Comments:         Anesthesia Quick Evaluation

## 2013-11-06 LAB — BASIC METABOLIC PANEL
BUN: 7 mg/dL (ref 6–23)
CALCIUM: 8.5 mg/dL (ref 8.4–10.5)
CHLORIDE: 103 meq/L (ref 96–112)
CO2: 25 mEq/L (ref 19–32)
CREATININE: 0.49 mg/dL — AB (ref 0.50–1.10)
GFR calc non Af Amer: >90 mL/min (ref >90)
Glucose, Bld: 233 mg/dL — ABNORMAL HIGH (ref 70–99)
Potassium: 4.6 mEq/L (ref 3.7–5.3)
Sodium: 139 mEq/L (ref 137–147)

## 2013-11-06 MED ORDER — SODIUM CHLORIDE 0.9 % IV BOLUS (SEPSIS)
1000.0000 mL | Freq: Once | INTRAVENOUS | Status: AC
  Administered 2013-11-06: 1000 mL via INTRAVENOUS

## 2013-11-06 MED ORDER — KCL IN DEXTROSE-NACL 20-5-0.45 MEQ/L-%-% IV SOLN
INTRAVENOUS | Status: DC
  Administered 2013-11-06: 100 mL/h via INTRAVENOUS
  Administered 2013-11-07 – 2013-11-08 (×2): via INTRAVENOUS
  Administered 2013-11-08: 75 mL via INTRAVENOUS
  Administered 2013-11-08: 14:00:00 via INTRAVENOUS
  Administered 2013-11-09 (×2): 75 mL/h via INTRAVENOUS
  Administered 2013-11-10: 10:00:00 via INTRAVENOUS
  Administered 2013-11-10: 75 mL via INTRAVENOUS
  Administered 2013-11-11: 17:00:00 via INTRAVENOUS
  Filled 2013-11-06 (×15): qty 1000

## 2013-11-06 MED ORDER — HYDROMORPHONE 0.3 MG/ML IV SOLN
INTRAVENOUS | Status: DC
  Administered 2013-11-06: 01:00:00 via INTRAVENOUS
  Administered 2013-11-06: 0.6 mg via INTRAVENOUS
  Administered 2013-11-07 – 2013-11-08 (×4): 0.3 mg via INTRAVENOUS

## 2013-11-06 MED ORDER — BIOTENE DRY MOUTH MT LIQD
15.0000 mL | Freq: Two times a day (BID) | OROMUCOSAL | Status: DC
  Administered 2013-11-07 – 2013-11-10 (×7): 15 mL via OROMUCOSAL

## 2013-11-06 MED ORDER — PANTOPRAZOLE SODIUM 40 MG IV SOLR
40.0000 mg | Freq: Every day | INTRAVENOUS | Status: DC
  Administered 2013-11-06 (×2): 40 mg via INTRAVENOUS
  Filled 2013-11-06 (×4): qty 40

## 2013-11-06 MED ORDER — DIPHENHYDRAMINE HCL 12.5 MG/5ML PO ELIX: 12.5000 mg | ORAL_SOLUTION | Freq: Four times a day (QID) | ORAL | Status: DC | PRN

## 2013-11-06 MED ORDER — HYDROMORPHONE HCL PF 1 MG/ML IJ SOLN: 0.2500 mg | INTRAMUSCULAR | Status: DC | PRN

## 2013-11-06 MED ORDER — NALOXONE HCL 0.4 MG/ML IJ SOLN: 0.4000 mg | INTRAMUSCULAR | Status: DC | PRN

## 2013-11-06 MED ORDER — SODIUM CHLORIDE 0.9 % IV SOLN
1.0000 g | Freq: Every day | INTRAVENOUS | Status: AC
  Administered 2013-11-06 – 2013-11-11 (×6): 1 g via INTRAVENOUS
  Filled 2013-11-06 (×6): qty 1

## 2013-11-06 MED ORDER — CHLORHEXIDINE GLUCONATE 0.12 % MT SOLN
15.0000 mL | Freq: Two times a day (BID) | OROMUCOSAL | Status: DC
  Administered 2013-11-07 – 2013-11-12 (×9): 15 mL via OROMUCOSAL
  Filled 2013-11-06 (×13): qty 15

## 2013-11-06 MED ORDER — ONDANSETRON HCL 4 MG/2ML IJ SOLN: 4.0000 mg | Freq: Four times a day (QID) | INTRAMUSCULAR | Status: DC | PRN

## 2013-11-06 MED ORDER — GLYCOPYRROLATE 0.2 MG/ML IJ SOLN
INTRAMUSCULAR | Status: DC | PRN
  Administered 2013-11-06: 0.4 mg via INTRAVENOUS

## 2013-11-06 MED ORDER — SODIUM CHLORIDE 0.9 % IJ SOLN: 9.0000 mL | INTRAMUSCULAR | Status: DC | PRN

## 2013-11-06 MED ORDER — ENOXAPARIN SODIUM 40 MG/0.4ML ~~LOC~~ SOLN
40.0000 mg | SUBCUTANEOUS | Status: DC
  Administered 2013-11-07 – 2013-11-09 (×3): 40 mg via SUBCUTANEOUS
  Filled 2013-11-06 (×4): qty 0.4

## 2013-11-06 MED ORDER — KCL IN DEXTROSE-NACL 20-5-0.45 MEQ/L-%-% IV SOLN
INTRAVENOUS | Status: AC
  Filled 2013-11-06: qty 1000

## 2013-11-06 MED ORDER — DIPHENHYDRAMINE HCL 50 MG/ML IJ SOLN: 12.5000 mg | Freq: Four times a day (QID) | INTRAMUSCULAR | Status: DC | PRN

## 2013-11-06 MED ORDER — HYDROMORPHONE 0.3 MG/ML IV SOLN
INTRAVENOUS | Status: AC
  Filled 2013-11-06: qty 25

## 2013-11-06 MED ORDER — ONDANSETRON HCL 4 MG PO TABS: 4.0000 mg | ORAL_TABLET | Freq: Four times a day (QID) | ORAL | Status: DC | PRN

## 2013-11-06 MED ORDER — PROMETHAZINE HCL 25 MG/ML IJ SOLN: 6.2500 mg | INTRAMUSCULAR | Status: DC | PRN

## 2013-11-06 MED ORDER — NEOSTIGMINE METHYLSULFATE 10 MG/10ML IV SOLN
INTRAVENOUS | Status: DC | PRN
  Administered 2013-11-06: 3 mg via INTRAVENOUS

## 2013-11-06 MED ORDER — LACTATED RINGERS IV SOLN: INTRAVENOUS | Status: DC

## 2013-11-06 NOTE — Brief Op Note (Signed)
11/05/2013 - 11/06/2013  12:20 AM  PATIENT:  Belinda Nielsen  63 y.o. female  PRE-OPERATIVE DIAGNOSIS:  Perforated Viscus  POST-OPERATIVE DIAGNOSIS:  Perforated gastrojejunostomy  PROCEDURE:  1. Exploratory laparotomy  2. Lysis of adhesions (30 min)  3. Resection of gastrojejunostomy  4. Creation of Roux-en-Y gastrojejunostomy  SURGEON:  Surgeon(s) and Role:    * Earnstine Regal, MD - Primary  ANESTHESIA:   general  EBL:  Total I/O In: -  Out: 1150 [Urine:1150]  BLOOD ADMINISTERED:none  DRAINS: none   LOCAL MEDICATIONS USED:  NONE  SPECIMEN:  Excision  DISPOSITION OF SPECIMEN:  PATHOLOGY  COUNTS:  YES  TOURNIQUET:  * No tourniquets in log *  DICTATION: .Other Dictation: Dictation Number [id number]  PLAN OF CARE: Admit to inpatient   PATIENT DISPOSITION:  PACU - hemodynamically stable.   Delay start of Pharmacological VTE agent (>24hrs) due to surgical blood loss or risk of bleeding: yes  Earnstine Regal, MD, [facility] Office: [phone]

## 2013-11-06 NOTE — Progress Notes (Signed)
1 Day Post-Op  Subjective: Comfortable.  Awake and alert.  Objective: Vital signs in last 24 hours: Temp:  [96.1 F (35.6 C)-98 F (36.7 C)] 97.5 F (36.4 C) (05/03 0618) Pulse Rate:  [76-92] 88 (05/03 0618) Resp:  [11-18] 16 (05/03 0618) BP: (118-148)/(53-84) 120/82 mmHg (05/03 0618) SpO2:  [96 %-100 %] 98 % (05/03 0618)    Intake/Output from previous day: 05/02 0701 - 05/03 0700 In: 800 [I.V.:800] Out: 1450 [Urine:1400; Emesis/NG output:50] Intake/Output this shift:    PE: General- In NAD Abdomen-soft, flat, quiet, dressing dry  Lab Results:   Recent Labs  11/05/13 1717  WBC 18.5*  HGB 14.8  HCT 44.1  PLT 351   BMET  Recent Labs  11/05/13 1717  NA 141  K 3.2*  CL 102  CO2 24  GLUCOSE 154*  BUN 8  CREATININE 0.44*  CALCIUM 8.8   PT/INR No results found for this basename: LABPROT, INR,  in the last 72 hours Comprehensive Metabolic Panel:    Component Value Date/Time   NA 141 11/05/2013 1717   K 3.2* 11/05/2013 1717   CL 102 11/05/2013 1717   CO2 24 11/05/2013 1717   BUN 8 11/05/2013 1717   CREATININE 0.44* 11/05/2013 1717   GLUCOSE 154* 11/05/2013 1717   CALCIUM 8.8 11/05/2013 1717   AST 18 11/05/2013 1717   ALT 21 11/05/2013 1717   ALKPHOS 78 11/05/2013 1717   BILITOT 0.4 11/05/2013 1717   PROT 6.2 11/05/2013 1717   ALBUMIN 3.5 11/05/2013 1717     Studies/Results: Ct Abdomen Pelvis W Contrast  11/05/2013   CLINICAL DATA:  Pain. History pancreatic cancer. Prior history of Whipple surgery. Appendectomy, hysterectomy cul colon resection, cholecystectomy.  EXAM: CT ABDOMEN AND PELVIS WITH CONTRAST  TECHNIQUE: Multidetector CT imaging of the abdomen and pelvis was performed using the standard protocol following bolus administration of intravenous contrast.  CONTRAST:  34mL OMNIPAQUE IOHEXOL 300 MG/ML SOLN, 159mL OMNIPAQUE IOHEXOL 300 MG/ML SOLN  COMPARISON:  None.  FINDINGS: Multiple small hepatic cysts are noted. Liver is otherwise unremarkable. Spleen is normal. Pancreas  unremarkable. Surgical clips in the gallbladder fossa. No biliary distention.  Adrenals are unremarkable. Simple renal cysts are present. No hydronephrosis. Mild bladder distention. Hysterectomy. No pelvic mass. Mild ascites.  No adenopathy. The abdominal aorta is widely patent. Visceral vessels are patent. Portal vein and splenic vein patent. Hepatic veins patent.  Free intraperitoneal air is noted. Thickened loops of small bowel with proximal small bowel distention noted. Distal bowel is nondistended. The colon is nondistended. Small bowel obstruction cannot be excluded. Small bowel inflammation and/or ischemia may be present. Given free air, small bowel perforation may be present. Postsurgical changes noted in the of rectosigmoid region. Stool is present in the colon.  Heart size normal. Basilar atelectasis and/or mild infiltrates. Small pleural effusions. Degenerative changes lumbar spine with severe scoliosis concave right. Degenerative changes both hips.  IMPRESSION: 1. Distended loops of proximal small bowel with severe bowel wall thickening. This suggests ischemia and/or inflammatory bowel. Proximal small bowel obstruction cannot be excluded .  2. Free intraperitoneal air. Bowel perforation could present in this fashion.  3. Ascites.  These results were called by telephone at the time of interpretation on 11/05/2013 at 6:54 PM to P.A. Montine Circle , who verbally acknowledged these results.   Electronically Signed   By: Marcello Moores  Register   On: 11/05/2013 18:55    Anti-infectives: Anti-infectives   Start     Dose/Rate Route Frequency Ordered Stop  11/06/13 2200  ertapenem (INVANZ) 1 g in sodium chloride 0.9 % 50 mL IVPB     1 g 100 mL/hr over 30 Minutes Intravenous Daily at bedtime 11/06/13 0145     11/06/13 0600  ertapenem (INVANZ) 1 g in sodium chloride 0.9 % 50 mL IVPB     1 g 100 mL/hr over 30 Minutes Intravenous On call to O.R. 11/05/13 2157 11/05/13 2205      Assessment Principal  Problem:   Perforated small bowel s/p resection of loop gastrojejeunostomy and creation of Roux en Y gastrojejunostomy 11/05/13-stable overnight  Hypokalemia on admission    LOS: 1 day   Plan: OOB.  Check lab.   Rhunette Croft Tru Rana 11/06/2013

## 2013-11-06 NOTE — Transfer of Care (Signed)
Immediate Anesthesia Transfer of Care Note  Patient: Belinda Nielsen  Procedure(s) Performed: Procedure(s) (LRB): EXPLORATORY LAPAROTOMY (N/A) LYSIS OF ADHESION (N/A)  Patient Location: PACU  Anesthesia Type: General  Level of Consciousness: sedated, patient cooperative and responds to stimulation  Airway & Oxygen Therapy: Patient Spontanous Breathing and Patient connected to face mask oxgen  Post-op Assessment: Report given to PACU RN and Post -op Vital signs reviewed and stable  Post vital signs: Reviewed and stable  Complications: No apparent anesthesia complications

## 2013-11-06 NOTE — Op Note (Signed)
Belinda Nielsen, Belinda Nielsen                ACCOUNT NO.:  [id number]  MEDICAL RECORD NO.:  [id number]  LOCATION:  [facility]                         FACILITY:  [facility]  PHYSICIAN:  Earnstine Regal, MD      DATE OF BIRTH:  [birth year]  DATE OF PROCEDURE:  11/05/2013                              OPERATIVE REPORT   PREOPERATIVE DIAGNOSIS:  Perforated viscus.  POSTOPERATIVE DIAGNOSIS:  Perforated gastrojejunostomy.  PROCEDURE: 1. Exploratory laparotomy. 2. Lysis of adhesions (30 minutes). 3. Resection of gastrojejunostomy. 4. Creation of a Roux-en-Y gastrojejunostomy with jejunojejunostomy.  SURGEON:  Armandina Gemma, MD, FACS   ANESTHESIA:  General per Dr. Myrtie Soman.  ESTIMATED BLOOD LOSS:  Minimal.  PREPARATION:  ChloraPrep.  COMPLICATIONS:  None.  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  The patient had also previously undergone a resection for diverticular disease and cholecystectomy.  The patient presented to the emergency department with new onset abdominal pain. CBC showed an elevated [place] blood [place] 400[address] with a left shift.  CT scan abdomen and pelvis showed free air and ascites consistent with perforated viscus.  The patient was evaluated in the emergency department and prepared urgently for the operating room.  BODY OF REPORT:  Procedure was done in OR #1 at the [facility].  The patient was brought to the operating room, placed in supine position on the operating room table.  Following administration of general anesthesia, the patient was positioned and then prepped and draped in usual aseptic fashion.  After ascertaining that an adequate level of anesthesia had been achieved, the patient's previous midline incision was opened just above the level of the umbilicus using a #76 blade.  Dissection was carried down to the  fascia. Fascia was incised sharply with a #10 blade and the peritoneal cavity was entered cautiously.  There are extensive adhesions in the upper abdomen.  Using Metzenbaum scissors and a #10 blade, the adhesions were gently taken down, taking care to avoid injury to the bowel and to the liver, which was adherent to the anterior abdominal wall.  Upon entering the peritoneal cavity, there was a copious amount of ascitic fluid. This was bile stained.  After further lysis of adhesions, the fluid was evacuated.  Exploration reveals a perforation arising in a loop of small bowel.  With further dissection, this appears to be a loop gastrojejunostomy.  The perforation measures 5 mm in diameter and is just distal to the anastomosis at the beginning of efferent limb. Defect is closed with a 2-0 silk suture.  This will prevent further contamination.  The stomach was mobilized.  The small bowel was mobilized.  Using a TA 60 stapler, the stomach is transected just above the level of the anastomosis.  The staple line was oversewn with interrupted 2-0 silk sutures.  Using GIA staplers, the jejunal side of the anastomosis was resected by transecting the jejunum proximal and distal to the anastomosis.  The mesentery was divided with the LigaSure.  The gastrojejunostomy was thus completely resected.  Upon opening the anastomosis was markedly stenotic with  approximately a 1.5 cm in diameter lumen.  The area of ulceration again was approximately 3 cm downstream in the efferent limb.  Next, the jejunostomy was created between the afferent limb and efferent limb approximately 40 cm downstream from what will be the new gastric anastomosis.  This was performed in a side-to-side fashion with a GIA stapler.  The enterotomy was closed with a TA 60 stapler.  Sutures were placed at each end of the staple line to avoid tension on the staple line.  The anastomosis appears to be widely patent.  Next, a  gastrojejunostomy was performed with the Roux-Y limb.  This was performed in a side-to-side fashion with a GIA stapler.  Again, the enterotomy was closed with a TA 60 stapler.  A 2-0 silk sutures were used to over sew the anastomosis at each end.  Nasogastric tube was properly positioned within the stomach.  The abdomen was then copiously irrigated with warm saline.  Good hemostasis was noted.  Fluid was evacuated.  Omentum was used to cover the gastrojejunostomy anastomosis. Midline incision was then closed with interrupted 0-Novafil simple sutures.  Subcutaneous tissues were irrigated.  Skin was closed with stainless steel staples.  Sterile dressings were applied.  The patient was awakened from anesthesia and brought to the recovery room.  The patient tolerated the procedure well.   Earnstine Regal, MD, [facility] Office: [phone]    TMG/MEDQ  D:  11/06/2013  T:  11/06/2013  Job:  [id number]

## 2013-11-07 ENCOUNTER — Encounter (HOSPITAL_COMMUNITY): Payer: Self-pay | Admitting: Surgery

## 2013-11-07 DIAGNOSIS — D3A8 Other benign neuroendocrine tumors: Secondary | ICD-10-CM | POA: Diagnosis present

## 2013-11-07 LAB — CBC
HCT: 39.9 % (ref 36.0–46.0)
Hemoglobin: 12.8 g/dL (ref 12.0–15.0)
MCH: 28.8 pg (ref 26.0–34.0)
MCHC: 32.1 g/dL (ref 30.0–36.0)
MCV: 89.9 fL (ref 78.0–100.0)
PLATELETS: 279 10*3/uL (ref 150–400)
RBC: 4.44 MIL/uL (ref 3.87–5.11)
RDW: 13.4 % (ref 11.5–15.5)
WBC: 14 10*3/uL — AB (ref 4.0–10.5)

## 2013-11-07 LAB — BASIC METABOLIC PANEL
BUN: 5 mg/dL — ABNORMAL LOW (ref 6–23)
CHLORIDE: 102 meq/L (ref 96–112)
CO2: 28 mEq/L (ref 19–32)
Calcium: 8.5 mg/dL (ref 8.4–10.5)
Creatinine, Ser: 0.42 mg/dL — ABNORMAL LOW (ref 0.50–1.10)
GFR calc non Af Amer: >90 mL/min (ref >90)
Glucose, Bld: 126 mg/dL — ABNORMAL HIGH (ref 70–99)
POTASSIUM: 4.2 meq/L (ref 3.7–5.3)
SODIUM: 137 meq/L (ref 137–147)

## 2013-11-07 MED ORDER — ACETAMINOPHEN 10 MG/ML IV SOLN
1000.0000 mg | Freq: Four times a day (QID) | INTRAVENOUS | Status: AC
  Administered 2013-11-07 – 2013-11-08 (×4): 1000 mg via INTRAVENOUS
  Filled 2013-11-07 (×4): qty 100

## 2013-11-07 MED ORDER — PANTOPRAZOLE SODIUM 40 MG IV SOLR
40.0000 mg | Freq: Two times a day (BID) | INTRAVENOUS | Status: DC
  Administered 2013-11-07 – 2013-11-11 (×8): 40 mg via INTRAVENOUS
  Filled 2013-11-07 (×9): qty 40

## 2013-11-07 NOTE — Progress Notes (Signed)
Patient Active Problem List   Diagnosis Date Noted  . Primary neuroendocrine tumor of pancreas s/p Whipple 2012 11/07/2013  . Perforated small bowel s/p resection of loop gastrojejeunostomy and creation of Roux en Y gastrojejunostomy 11/05/13 11/05/2013  . Bunion of great toe of right foot 05/12/2013  . Midfoot collapse 05/12/2013     Sitting in chair Incision c/d/i  Continue NGT Await return of bowel function I updated the patient's status to the patient / daughter.  Recommendations were made.  Questions were answered.  The family expressed understanding & appreciation.

## 2013-11-07 NOTE — Care Management Note (Signed)
    Page 1 of 1   11/07/2013     10:46:24 AM CARE MANAGEMENT NOTE 11/07/2013  Patient:  Belinda Nielsen,Belinda Nielsen   Account Number:  [id number]  Date Initiated:  11/07/2013  Documentation initiated by:  Sunday Spillers  Subjective/Objective Assessment:   63 yo female admitted s/p perf, exploratory lap. PTA lived at home with daughter.     Action/Plan:   Home when stable   Anticipated DC Date:  11/07/2013   Anticipated DC Plan:  [place]  CM consult      Choice offered to / List presented to:             Status of service:  Completed, signed off Medicare Important Message given?   (If response is "NO", the following Medicare IM given date fields will be blank) Date Medicare IM given:   Date Additional Medicare IM given:    Discharge Disposition:  HOME/SELF CARE  Per UR Regulation:  Reviewed for med. necessity/level of care/duration of stay  If discussed at [place] of Stay Meetings, dates discussed:    Comments:

## 2013-11-07 NOTE — Progress Notes (Signed)
2 Days Post-Op  Subjective: She is really sore and tender, doesn't complain much, but a bit apprehensive.  Ng bothers her the most dark old blood colored drainage coming from the NG.  It's old and clear, NG is working well. She is on Protonix already.  Objective: Vital signs in last 24 hours: Temp:  [97.7 F (36.5 C)-98.3 F (36.8 C)] 97.7 F (36.5 C) (05/04 0530) Pulse Rate:  [78-84] 83 (05/04 0530) Resp:  [14-23] 17 (05/04 0730) BP: (124-132)/(70-81) 128/72 mmHg (05/04 0530) SpO2:  [97 %-100 %] 100 % (05/04 0730) Last BM Date: 11/04/13 NPO Afebrile, VSS Labs OK WBC is up  But better Intake/Output from previous day: 05/03 0701 - 05/04 0700 In: 2696.7 [I.V.:2696.7] Out: 2350 [Urine:2000; Emesis/NG output:350] Intake/Output this shift:    General appearance: alert, cooperative, no distress and anxious and hurts some. Resp: clear to auscultation bilaterally GI: soft, tender, no bowel sounds.  Wound is closed and looks good.  Lab Results:   Recent Labs  11/05/13 1717 11/07/13 0430  WBC 18.5* 14.0*  HGB 14.8 12.8  HCT 44.1 39.9  PLT 351 279    BMET  Recent Labs  11/06/13 0821 11/07/13 0430  NA 139 137  K 4.6 4.2  CL 103 102  CO2 25 28  GLUCOSE 233* 126*  BUN 7 5*  CREATININE 0.49* 0.42*  CALCIUM 8.5 8.5   PT/INR No results found for this basename: LABPROT, INR,  in the last 72 hours   Recent Labs Lab 11/05/13 1717  AST 18  ALT 21  ALKPHOS 78  BILITOT 0.4  PROT 6.2  ALBUMIN 3.5     Lipase     Component Value Date/Time   LIPASE 17 11/05/2013 1717     Studies/Results: Ct Abdomen Pelvis W Contrast  11/05/2013   CLINICAL DATA:  Pain. History pancreatic cancer. Prior history of Whipple surgery. Appendectomy, hysterectomy cul colon resection, cholecystectomy.  EXAM: CT ABDOMEN AND PELVIS WITH CONTRAST  TECHNIQUE: Multidetector CT imaging of the abdomen and pelvis was performed using the standard protocol following bolus administration of intravenous  contrast.  CONTRAST:  81mL OMNIPAQUE IOHEXOL 300 MG/ML SOLN, 138mL OMNIPAQUE IOHEXOL 300 MG/ML SOLN  COMPARISON:  None.  FINDINGS: Multiple small hepatic cysts are noted. Liver is otherwise unremarkable. Spleen is normal. Pancreas unremarkable. Surgical clips in the gallbladder fossa. No biliary distention.  Adrenals are unremarkable. Simple renal cysts are present. No hydronephrosis. Mild bladder distention. Hysterectomy. No pelvic mass. Mild ascites.  No adenopathy. The abdominal aorta is widely patent. Visceral vessels are patent. Portal vein and splenic vein patent. Hepatic veins patent.  Free intraperitoneal air is noted. Thickened loops of small bowel with proximal small bowel distention noted. Distal bowel is nondistended. The colon is nondistended. Small bowel obstruction cannot be excluded. Small bowel inflammation and/or ischemia may be present. Given free air, small bowel perforation may be present. Postsurgical changes noted in the of rectosigmoid region. Stool is present in the colon.  Heart size normal. Basilar atelectasis and/or mild infiltrates. Small pleural effusions. Degenerative changes lumbar spine with severe scoliosis concave right. Degenerative changes both hips.  IMPRESSION: 1. Distended loops of proximal small bowel with severe bowel wall thickening. This suggests ischemia and/or inflammatory bowel. Proximal small bowel obstruction cannot be excluded .  2. Free intraperitoneal air. Bowel perforation could present in this fashion.  3. Ascites.  These results were called by telephone at the time of interpretation on 11/05/2013 at 6:54 PM to P.A. Montine Circle , who  verbally acknowledged these results.   Electronically Signed   By: Marcello Moores  Register   On: 11/05/2013 18:55    Medications: . antiseptic oral rinse  15 mL Mouth Rinse q12n4p  . chlorhexidine  15 mL Mouth Rinse BID  . enoxaparin (LOVENOX) injection  40 mg Subcutaneous Q24H  . ertapenem (INVANZ) IV  1 g Intravenous QHS  .  HYDROmorphone PCA 0.3 mg/mL   Intravenous 6 times per day  . pantoprazole (PROTONIX) IV  40 mg Intravenous QHS   . dextrose 5 % and 0.45 % NaCl with KCl 20 mEq/L 100 mL/hr at 11/07/13 0331   Prior to Admission medications   Medication Sig Start Date End Date Taking? Authorizing Provider  estrogens, conjugated, (PREMARIN) 0.625 MG tablet Take 0.625 mg by mouth daily. Take daily for 21 days then do not take for 7 days.   Yes Historical Provider, MD  ibuprofen (ADVIL,MOTRIN) 200 MG tablet Take 200 mg by mouth every 6 (six) hours as needed (pain).   Yes Historical Provider, MD  Multiple Vitamins-Minerals (MULTIVITAMIN WITH MINERALS) tablet Take 1 tablet by mouth daily.   Yes Historical Provider, MD     Assessment/Plan Perforated gastrojejunostomy   Exploratory laparotomy 2. Lysis of adhesions (30 min) 3. Resection of gastrojejunostomy 4. Creation of Roux-en-Y gastrojejunostomy, . Exploratory laparotomy 2. Lysis of adhesions (30 min) 3. Resection of gastrojejunostomy 4. Creation of Roux-en-Y gastrojejunostomy, Earnstine Regal, MD. History of pancreatic endocrine neoplasm resection at [facility] in 2012 by Dr. Candis Shine Hx of colon resection in [facility] for diverticular disease S/p cholecystectomy/appendectomy Hx of bladder prolapase   Plan:  IV tylenol, mobilize, OOB to chair start moving today.  Continue NG, PPI, Antibiotics, she was fine and normal till onset of pain on Saturday 11/05/13.     LOS: 2 days    Earnstine Regal 11/07/2013

## 2013-11-08 ENCOUNTER — Encounter (HOSPITAL_COMMUNITY): Payer: Self-pay | Admitting: Surgery

## 2013-11-08 LAB — BASIC METABOLIC PANEL
BUN: 4 mg/dL — ABNORMAL LOW (ref 6–23)
CO2: 28 meq/L (ref 19–32)
Calcium: 8.4 mg/dL (ref 8.4–10.5)
Chloride: 104 mEq/L (ref 96–112)
Creatinine, Ser: 0.41 mg/dL — ABNORMAL LOW (ref 0.50–1.10)
GFR calc Af Amer: >90 mL/min (ref >90)
GFR calc non Af Amer: >90 mL/min (ref >90)
GLUCOSE: 116 mg/dL — AB (ref 70–99)
POTASSIUM: 4.1 meq/L (ref 3.7–5.3)
SODIUM: 139 meq/L (ref 137–147)

## 2013-11-08 LAB — CBC
HEMATOCRIT: 35.4 % — AB (ref 36.0–46.0)
Hemoglobin: 11.7 g/dL — ABNORMAL LOW (ref 12.0–15.0)
MCH: 29.5 pg (ref 26.0–34.0)
MCHC: 33.1 g/dL (ref 30.0–36.0)
MCV: 89.2 fL (ref 78.0–100.0)
PLATELETS: 286 10*3/uL (ref 150–400)
RBC: 3.97 MIL/uL (ref 3.87–5.11)
RDW: 13.2 % (ref 11.5–15.5)
WBC: 8 10*3/uL (ref 4.0–10.5)

## 2013-11-08 MED ORDER — HYDROMORPHONE HCL PF 1 MG/ML IJ SOLN: 0.5000 mg | INTRAMUSCULAR | Status: DC | PRN

## 2013-11-08 MED ORDER — ACETAMINOPHEN 10 MG/ML IV SOLN
1000.0000 mg | Freq: Four times a day (QID) | INTRAVENOUS | Status: DC
  Administered 2013-11-08: 1000 mg via INTRAVENOUS
  Filled 2013-11-08 (×3): qty 100

## 2013-11-08 MED ORDER — ACETAMINOPHEN 10 MG/ML IV SOLN
1000.0000 mg | Freq: Four times a day (QID) | INTRAVENOUS | Status: AC
  Administered 2013-11-09: 1000 mg via INTRAVENOUS
  Filled 2013-11-08 (×4): qty 100

## 2013-11-08 NOTE — Anesthesia Postprocedure Evaluation (Signed)
  Anesthesia Post-op Note  Patient: Belinda Nielsen  Procedure(s) Performed: Procedure(s) (LRB): EXPLORATORY LAPAROTOMY (N/A) LYSIS OF ADHESION (N/A)  Patient Location: PACU  Anesthesia Type: General  Level of Consciousness: awake and alert   Airway and Oxygen Therapy: Patient Spontanous Breathing  Post-op Pain: mild  Post-op Assessment: Post-op Vital signs reviewed, Patient's Cardiovascular Status Stable, Respiratory Function Stable, Patent Airway and No signs of Nausea or vomiting  Last Vitals:  Filed Vitals:   11/08/13 1000  BP: 124/72  Pulse: 65  Temp: 36.7 C  Resp: 19    Post-op Vital Signs: stable   Complications: No apparent anesthesia complications

## 2013-11-08 NOTE — Progress Notes (Signed)
3 Days Post-Op  Subjective: No flatus some grumbling of her stomach.  NG is working.  She wants to walk more and was having trouble getting help.  Objective: Vital signs in last 24 hours: Temp:  [97.2 F (36.2 C)-98.2 F (36.8 C)] 97.2 F (36.2 C) (05/05 0618) Pulse Rate:  [59-80] 70 (05/05 0618) Resp:  [12-19] 17 (05/05 0618) BP: (113-127)/(68-80) 127/80 mmHg (05/05 0618) SpO2:  [96 %-100 %] 99 % (05/05 0618) Last BM Date: 11/04/13 250 from NG,  NPO Afebrile, VSS,  Labs OK Intake/Output from previous day: 05/04 0701 - 05/05 0700 In: 2550 [I.V.:2400; IV Piggyback:150] Out: 2525 [Urine:2275; Emesis/NG output:250] Intake/Output this shift:    General appearance: alert, cooperative and no distress Resp: clear to auscultation bilaterally GI: soft, very tender, no bowel sounds, not much from the NG  Lab Results:   Recent Labs  11/07/13 0430 11/08/13 0430  WBC 14.0* 8.0  HGB 12.8 11.7*  HCT 39.9 35.4*  PLT 279 286    BMET  Recent Labs  11/07/13 0430 11/08/13 0430  NA 137 139  K 4.2 4.1  CL 102 104  CO2 28 28  GLUCOSE 126* 116*  BUN 5* 4*  CREATININE 0.42* 0.41*  CALCIUM 8.5 8.4   PT/INR No results found for this basename: LABPROT, INR,  in the last 72 hours   Recent Labs Lab 11/05/13 1717  AST 18  ALT 21  ALKPHOS 78  BILITOT 0.4  PROT 6.2  ALBUMIN 3.5     Lipase     Component Value Date/Time   LIPASE 17 11/05/2013 1717     Studies/Results: No results found.  Medications: . antiseptic oral rinse  15 mL Mouth Rinse q12n4p  . chlorhexidine  15 mL Mouth Rinse BID  . enoxaparin (LOVENOX) injection  40 mg Subcutaneous Q24H  . ertapenem (INVANZ) IV  1 g Intravenous QHS  . HYDROmorphone PCA 0.3 mg/mL   Intravenous 6 times per day  . pantoprazole (PROTONIX) IV  40 mg Intravenous Q12H    Assessment/Plan Perforated gastrojejunostomy, 11/05/13. 1. Exploratory laparotomy 2. Lysis of adhesions (30 min) 3. Resection of gastrojejunostomy 4. Creation  of Roux-en-Y gastrojejunostomy, . Exploratory laparotomy 2. Lysis of adhesions (30 min) 3. Resection of gastrojejunostomy 4. Creation of Roux-en-Y gastrojejunostomy, Earnstine Regal, MD.  History of pancreatic endocrine neoplasm resection at [facility] in 2012 by Dr. Candis Shine  Hx of colon resection in [facility] for diverticular disease  S/p cholecystectomy/appendectomy  Hx of bladder prolapase   Plan:  Continue bowel rest, continue ambulation, and IS.    She has had 3 days of Invanz and will get the 4th today.  Continue IV tylenol.    LOS: 3 days    Earnstine Regal 11/08/2013

## 2013-11-08 NOTE — Progress Notes (Addendum)
Dilaudid PCA d/c'd, 90ml left in PCA syringe. Wasted with Ernest Mallick, RN.   Birdie Hopes 1:43 PM

## 2013-11-08 NOTE — Progress Notes (Signed)
Awaiting return of bowel function. Walking more OK to d/c PCA - pt dislikes CO2 monitor & not using it much anyway

## 2013-11-09 NOTE — Progress Notes (Signed)
Agree w clamping trial as tolerated Obtain AXR films in AM Hold off on UGI if clinically improving  Adin Hector, M.D., F.A.C.S. Gastrointestinal and Minimally Invasive Surgery [facility] [address], [place], [place] [address] [phone] Main / Paging

## 2013-11-09 NOTE — Progress Notes (Signed)
4 Days Post-Op  Subjective: A little flatus, not much, nothing much from the NG.  She has some bowel sounds.  Objective: Vital signs in last 24 hours: Temp:  [98 F (36.7 C)-98.8 F (37.1 C)] 98 F (36.7 C) (05/06 0514) Pulse Rate:  [63-77] 74 (05/06 0514) Resp:  [16-19] 18 (05/06 0514) BP: (124-137)/(64-79) 134/79 mmHg (05/06 0514) SpO2:  [93 %-100 %] 93 % (05/06 0514) Last BM Date: 11/04/13 200 from NG recorded No BM NPO except ice Afebrile, VSS Labs OK  Intake/Output from previous day: 05/05 0701 - 05/06 0700 In: 1703.3 [I.V.:1703.3] Out: 4000 [Urine:3800; Emesis/NG output:200] Intake/Output this shift:    General appearance: alert, cooperative and no distress Resp: clear to auscultation bilaterally GI: soft sore, incision looks fine she has some bowel sounds.  Lab Results:   Recent Labs  11/07/13 0430 11/08/13 0430  WBC 14.0* 8.0  HGB 12.8 11.7*  HCT 39.9 35.4*  PLT 279 286    BMET  Recent Labs  11/07/13 0430 11/08/13 0430  NA 137 139  K 4.2 4.1  CL 102 104  CO2 28 28  GLUCOSE 126* 116*  BUN 5* 4*  CREATININE 0.42* 0.41*  CALCIUM 8.5 8.4   PT/INR No results found for this basename: LABPROT, INR,  in the last 72 hours   Recent Labs Lab 11/05/13 1717  AST 18  ALT 21  ALKPHOS 78  BILITOT 0.4  PROT 6.2  ALBUMIN 3.5     Lipase     Component Value Date/Time   LIPASE 17 11/05/2013 1717     Studies/Results: No results found.  Medications: . acetaminophen  1,000 mg Intravenous Q6H  . antiseptic oral rinse  15 mL Mouth Rinse q12n4p  . chlorhexidine  15 mL Mouth Rinse BID  . enoxaparin (LOVENOX) injection  40 mg Subcutaneous Q24H  . ertapenem (INVANZ) IV  1 g Intravenous QHS  . pantoprazole (PROTONIX) IV  40 mg Intravenous Q12H    Assessment/Plan 1.  Perforated gastrojejunostomy, 11/05/13. History of pancreatic endocrine neoplasm resection at [facility] in 2012 by Dr. Candis Shine  2.  1 Exploratory laparotomy 2. Lysis of adhesions  (30 min) 3. Resection of gastrojejunostomy 4. Creation of    Roux-en-Y gastrojejunostomy, Earnstine Regal, MD. 11/06/2013  3.  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  S/p cholecystectomy/appendectomy  5.  Hx of bladder prolapase   Plan:  Clamp NG, try some sips of clears, continue to mobilize, continue IV tylenol today.   LOS: 4 days    Earnstine Regal 11/09/2013

## 2013-11-10 ENCOUNTER — Inpatient Hospital Stay (HOSPITAL_COMMUNITY): Payer: BC Managed Care – PPO

## 2013-11-10 MED ORDER — ACETAMINOPHEN 325 MG PO TABS: 650.0000 mg | ORAL_TABLET | Freq: Four times a day (QID) | ORAL | Status: DC | PRN

## 2013-11-10 MED ORDER — OXYCODONE-ACETAMINOPHEN 5-325 MG PO TABS: 1.0000 | ORAL_TABLET | ORAL | Status: DC | PRN

## 2013-11-10 MED ORDER — ENOXAPARIN SODIUM 40 MG/0.4ML ~~LOC~~ SOLN
40.0000 mg | SUBCUTANEOUS | Status: DC
  Administered 2013-11-10 – 2013-11-11 (×2): 40 mg via SUBCUTANEOUS
  Filled 2013-11-10 (×3): qty 0.4

## 2013-11-10 NOTE — Progress Notes (Signed)
5 Days Post-Op  Subjective: She is just back from xray, she had some diarrhea last PM and she is concerned she has C diff again, she had it at [facility] after her Whipple procedure.  She feels good and asking about what she can eat and when she can drive.  Film is pending.  Objective: Vital signs in last 24 hours: Temp:  [98 F (36.7 C)-98.4 F (36.9 C)] 98 F (36.7 C) (05/07 0527) Pulse Rate:  [76-102] 98 (05/07 0527) Resp:  [16-18] 18 (05/07 0527) BP: (98-138)/(65-75) 118/71 mmHg (05/07 0527) SpO2:  [93 %-98 %] 93 % (05/07 0527) Weight:  [55.792 kg (123 lb)] 55.792 kg (123 lb) (05/07 0527) Last BM Date: 11/10/13 PO 180, 3 BM's recorded Afebrile. VSS Labs are normal yesterday Intake/Output from previous day: 05/06 0701 - 05/07 0700 In: 1010 [P.O.:180; I.V.:830] Out: 1100 [Urine:1100] Intake/Output this shift:    General appearance: alert, cooperative and no distress Resp:  Clear to ascultation GI: soft, bowel sounds still hypoactive.  Wound ok, no distension loose stools yesterday.  Lab Results:   Recent Labs  11/08/13 0430  WBC 8.0  HGB 11.7*  HCT 35.4*  PLT 286    BMET  Recent Labs  11/08/13 0430  NA 139  K 4.1  CL 104  CO2 28  GLUCOSE 116*  BUN 4*  CREATININE 0.41*  CALCIUM 8.4   PT/INR No results found for this basename: LABPROT, INR,  in the last 72 hours   Recent Labs Lab 11/05/13 1717  AST 18  ALT 21  ALKPHOS 78  BILITOT 0.4  PROT 6.2  ALBUMIN 3.5     Lipase     Component Value Date/Time   LIPASE 17 11/05/2013 1717     Studies/Results: No results found.  Medications: . antiseptic oral rinse  15 mL Mouth Rinse q12n4p  . chlorhexidine  15 mL Mouth Rinse BID  . enoxaparin (LOVENOX) injection  40 mg Subcutaneous Q24H  . ertapenem (INVANZ) IV  1 g Intravenous QHS  . pantoprazole (PROTONIX) IV  40 mg Intravenous Q12H    Assessment/Plan 1. Perforated gastrojejunostomy, 11/05/13. 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 Candis Shine  2. 1 Exploratory laparotomy 2. Lysis of adhesions (30 min) 3. Resection of gastrojejunostomy 4. Creation of Roux-en-Y gastrojejunostomy, Earnstine Regal, MD. 11/06/2013  3. 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 S/p cholecystectomy/appendectomy  5. Hx of bladder prolapase   Plan:  I will check the film ASAP, plan to get her NG out and start clears.  She has had 5 days of invanz.  She had C diff colitis after Whipple so she is concerned about this.   Film is normal, I am going to pull NG and start clears  LOS: 5 days    Earnstine Regal 11/10/2013

## 2013-11-10 NOTE — Progress Notes (Signed)
Ileus resolving Adv diet slowly Path d/w pt

## 2013-11-11 DIAGNOSIS — K289 Gastrojejunal ulcer, unspecified as acute or chronic, without hemorrhage or perforation: Secondary | ICD-10-CM | POA: Diagnosis present

## 2013-11-11 MED ORDER — OXYCODONE HCL 5 MG PO TABS: 5.0000 mg | ORAL_TABLET | ORAL | Status: DC | PRN

## 2013-11-11 MED ORDER — DIPHENHYDRAMINE HCL 50 MG/ML IJ SOLN: 12.5000 mg | Freq: Four times a day (QID) | INTRAMUSCULAR | Status: DC | PRN

## 2013-11-11 MED ORDER — PANTOPRAZOLE SODIUM 40 MG PO TBEC: 40.0000 mg | DELAYED_RELEASE_TABLET | Freq: Every day | ORAL | Status: AC

## 2013-11-11 MED ORDER — POLYETHYLENE GLYCOL 3350 17 G PO PACK
17.0000 g | PACK | Freq: Two times a day (BID) | ORAL | Status: DC | PRN
  Filled 2013-11-11: qty 1

## 2013-11-11 MED ORDER — METOPROLOL TARTRATE 12.5 MG HALF TABLET
12.5000 mg | ORAL_TABLET | Freq: Two times a day (BID) | ORAL | Status: DC | PRN
  Filled 2013-11-11: qty 1

## 2013-11-11 MED ORDER — HYDROMORPHONE HCL PF 1 MG/ML IJ SOLN: 0.5000 mg | INTRAMUSCULAR | Status: DC | PRN

## 2013-11-11 MED ORDER — DIPHENHYDRAMINE HCL 25 MG PO CAPS: 25.0000 mg | ORAL_CAPSULE | Freq: Four times a day (QID) | ORAL | Status: DC | PRN

## 2013-11-11 MED ORDER — ALUM & MAG HYDROXIDE-SIMETH 200-200-20 MG/5ML PO SUSP: 30.0000 mL | Freq: Four times a day (QID) | ORAL | Status: DC | PRN

## 2013-11-11 MED ORDER — PANTOPRAZOLE SODIUM 40 MG PO TBEC
40.0000 mg | DELAYED_RELEASE_TABLET | Freq: Every day | ORAL | Status: DC
  Administered 2013-11-12: 40 mg via ORAL
  Filled 2013-11-11: qty 1

## 2013-11-11 MED ORDER — MAGIC MOUTHWASH
15.0000 mL | Freq: Four times a day (QID) | ORAL | Status: DC | PRN
  Administered 2013-11-11: 15 mL via ORAL
  Filled 2013-11-11: qty 15

## 2013-11-11 NOTE — Progress Notes (Signed)
Patient ID: Belinda Nielsen, female   DOB: 08/05/1950, 63 y.o.   MRN: [id number] 6 Days Post-Op  Subjective: Pt feeling well.  Minimal pain.  Tolerating clears well.  Objective: Vital signs in last 24 hours: Temp:  [98.2 F (36.8 C)-98.5 F (36.9 C)] 98.5 F (36.9 C) (05/08 0552) Pulse Rate:  [61-90] 77 (05/08 0552) Resp:  [18] 18 (05/08 0552) BP: (110-129)/(67-70) 129/67 mmHg (05/08 0552) SpO2:  [96 %-98 %] 96 % (05/08 0552) Last BM Date: Nov 27, 2013  Intake/Output from previous day: Nov 28, 2022 0701 - 05/08 0700 In: 895 [I.V.:895] Out: 800 [Urine:800] Intake/Output this shift:    PE: Abd: soft, minimally tender, incision c/d/i with staples, +BS, ND  Lab Results:  No results found for this basename: WBC, HGB, HCT, PLT,  in the last 72 hours BMET No results found for this basename: NA, K, CL, CO2, GLUCOSE, BUN, CREATININE, CALCIUM,  in the last 72 hours PT/INR No results found for this basename: LABPROT, INR,  in the last 72 hours CMP     Component Value Date/Time   NA 139 11/08/2013 0430   K 4.1 11/08/2013 0430   CL 104 11/08/2013 0430   CO2 28 11/08/2013 0430   GLUCOSE 116* 11/08/2013 0430   BUN 4* 11/08/2013 0430   CREATININE 0.41* 11/08/2013 0430   CALCIUM 8.4 11/08/2013 0430   PROT 6.2 11/05/2013 1717   ALBUMIN 3.5 11/05/2013 1717   AST 18 11/05/2013 1717   ALT 21 11/05/2013 1717   ALKPHOS 78 11/05/2013 1717   BILITOT 0.4 11/05/2013 1717   GFRNONAA >90 11/08/2013 0430   GFRAA >90 11/08/2013 0430   Lipase     Component Value Date/Time   LIPASE 17 11/05/2013 1717       Studies/Results: Dg Abd 2 Views  11/27/13   CLINICAL DATA:  post op perforated GJ  EXAM: ABDOMEN - 2 VIEW  COMPARISON:  CT ABD/PELVIS W CM dated 11/05/2013  FINDINGS: The enteric tube coursing in the expected region of the stomach when correlated with CT. Air is seen within nondilated loops of bowel. There is no evidence of free air. Postsurgical changes with clips and sutures in the abdomen. Levoscoliosis of the thoracolumbar spine  and degenerative disc disease changes in the lower lumbar spine.  IMPRESSION: Nonobstructive bowel gas pattern.   Electronically Signed   By: Margaree Mackintosh M.D.   On: Nov 27, 2013 08:29    Anti-infectives: Anti-infectives   Start     Dose/Rate Route Frequency Ordered Stop   11/06/13 2200  ertapenem (INVANZ) 1 g in sodium chloride 0.9 % 50 mL IVPB     1 g 100 mL/hr over 30 Minutes Intravenous Daily at bedtime 11/06/13 0145     11/06/13 0600  ertapenem (INVANZ) 1 g in sodium chloride 0.9 % 50 mL IVPB     1 g 100 mL/hr over 30 Minutes Intravenous On call to O.R. 11/05/13 2157 11/05/13 2205       Assessment/Plan 1. Perforated gastrojejunostomy, 11/05/13. 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 Candis Shine  2. 1 Exploratory laparotomy 2. Lysis of adhesions (30 min) 3. Resection of gastrojejunostomy 4. Creation of Roux-en-Y gastrojejunostomy, Earnstine Regal, MD. 11/06/2013 POD 5  Plan: 1. Will advance to full liquids today and low fiber tomorrow.  If she is doing well, suspect she can go home tomorrow.  She will need to follow up next week for staple removal, I have arranged. 2. She will need to see Dr.  Gerkin initially, but future follow up with Dr. Vernard Gambles 3. D 7 of Invanz.  Will stop after today 4. SLIV    LOS: 6 days    Henreitta Cea 11/11/2013, 10:40 AM Pager: [phone]

## 2013-11-11 NOTE — Discharge Instructions (Signed)
ABDOMINAL SURGERY: POST OP INSTRUCTIONS ° °1. DIET: Follow a light bland diet the first 24 hours after arrival home, such as soup, liquids, crackers, etc.  Be sure to include lots of fluids daily.  Avoid fast food or heavy meals as your are more likely to get nauseated.  Eat a low fat the next few days after surgery.   °2. Take your usually prescribed home medications unless otherwise directed. °3. PAIN CONTROL: °a. Pain is best controlled by a usual combination of three different methods TOGETHER: °i. Ice/Heat °ii. Over the counter pain medication °iii. Prescription pain medication °b. Most patients will experience some swelling and bruising around the incisions.  Ice packs or heating pads (30-60 minutes up to 6 times a day) will help. Use ice for the first few days to help decrease swelling and bruising, then switch to heat to help relax tight/sore spots and speed recovery.  Some people prefer to use ice alone, heat alone, alternating between ice & heat.  Experiment to what works for you.  Swelling and bruising can take several weeks to resolve.   °c. It is helpful to take an over-the-counter pain medication regularly for the first few weeks.  Choose one of the following that works best for you: °i. Naproxen (Aleve, etc)  Two 220mg tabs twice a day °ii. Ibuprofen (Advil, etc) Three 200mg tabs four times a day (every meal & bedtime) °iii. Acetaminophen (Tylenol, etc) 500-650mg four times a day (every meal & bedtime) °d. A  prescription for pain medication (such as oxycodone, hydrocodone, etc) should be given to you upon discharge.  Take your pain medication as prescribed.  °i. If you are having problems/concerns with the prescription medicine (does not control pain, nausea, vomiting, rash, itching, etc), please call us [phone] to see if we need to switch you to a different pain medicine that will work better for you and/or control your side effect better. °ii. If you need a refill on your pain medication,  please contact your pharmacy.  They will contact our office to request authorization. Prescriptions will not be filled after 5 pm or on week-ends. °4. Avoid getting constipated.  Between the surgery and the pain medications, it is common to experience some constipation.  Increasing fluid intake and taking a fiber supplement (such as Metamucil, Citrucel, FiberCon, MiraLax, etc) 1-2 times a day regularly will usually help prevent this problem from occurring.  A mild laxative (prune juice, Milk of Magnesia, MiraLax, etc) should be taken according to package directions if there are no bowel movements after 48 hours.   °5. Watch out for diarrhea.  If you have many loose bowel movements, simplify your diet to bland foods & liquids for a few days.  Stop any stool softeners and decrease your fiber supplement.  Switching to mild anti-diarrheal medications (Kayopectate, Pepto Bismol) can help.  If this worsens or does not improve, please call us. °6. Wash / shower every day.  You may shower over the incision / wound.  Avoid baths until the skin is fully healed.  Continue to shower over incision(s) after the dressing is off. °7. Remove your waterproof bandages 5 days after surgery.  You may leave the incision open to air.  You may replace a dressing/Band-Aid to cover the incision for comfort if you wish. °8. ACTIVITIES as tolerated:   °a. You may resume regular (light) daily activities beginning the next day--such as daily self-care, walking, climbing stairs--gradually increasing activities as tolerated.  If you can   walk 30 minutes without difficulty, it is safe to try more intense activity such as jogging, treadmill, bicycling, low-impact aerobics, swimming, etc. °b. Save the most intensive and strenuous activity for last such as sit-ups, heavy lifting, contact sports, etc  Refrain from any heavy lifting or straining until you are off narcotics for pain control.   °c. DO NOT PUSH THROUGH PAIN.  Let pain be your guide: If it  hurts to do something, don't do it.  Pain is your body warning you to avoid that activity for another week until the pain goes down. °d. You may drive when you are no longer taking prescription pain medication, you can comfortably wear a seatbelt, and you can safely maneuver your car and apply brakes. °e. You may have sexual intercourse when it is comfortable.  °9. FOLLOW UP in our office °a. Please call [facility] at [phone] to set up an appointment to see your surgeon in the office for a follow-up appointment approximately 1-2 weeks after your surgery. °b. Make sure that you call for this appointment the day you arrive home to insure a convenient appointment time. °10. IF YOU HAVE DISABILITY OR FAMILY LEAVE FORMS, BRING THEM TO THE OFFICE FOR PROCESSING.  DO NOT GIVE THEM TO YOUR DOCTOR. ° ° °WHEN TO CALL US [phone]: °1. Poor pain control °2. Reactions / problems with new medications (rash/itching, nausea, etc)  °3. Fever over 101.5 F (38.5 C) °4. Inability to urinate °5. Nausea and/or vomiting °6. Worsening swelling or bruising °7. Continued bleeding from incision. °8. Increased pain, redness, or drainage from the incision ° °The clinic staff is available to answer your questions during regular business hours (8:30am-5pm).  Please don’t hesitate to call and ask to speak to one of our nurses for clinical concerns.   A surgeon from [facility] is always on call at the hospitals °  °If you have a medical emergency, go to the nearest emergency room or call 911. °  ° °[facility] °[address], [address], [place], [place]  [address] ? °MAIN: [phone] ? TOLL FREE: [phone] ? °FAX [phone] °[email or web address] ° °GETTING TO GOOD BOWEL HEALTH. °Irregular bowel habits such as constipation and diarrhea can lead to many problems over time.  Having one soft bowel movement a day is the most important way to prevent further problems.  The anorectal canal  is designed to handle stretching and feces to safely manage our ability to get rid of solid waste (feces, poop, stool) out of our body.  BUT, hard constipated stools can act like ripping concrete bricks and diarrhea can be a burning fire to this very sensitive area of our body, causing inflamed hemorrhoids, anal fissures, increasing risk is perirectal abscesses, abdominal pain/bloating, an making irritable bowel worse.     °The goal: ONE SOFT BOWEL MOVEMENT A DAY!  To have soft, regular bowel movements:  °  Drink at least 8 tall glasses of water a day.   °  Take plenty of fiber.  Fiber is the undigested part of plant food that passes into the colon, acting s “natures broom” to encourage bowel motility and movement.  Fiber can absorb and hold large amounts of water. This results in a larger, bulkier stool, which is soft and easier to pass. Work gradually over several weeks up to 6 servings a day of fiber (25g a day even more if needed) in the form of: °o Vegetables -- Root (potatoes, carrots, turnips),   leafy green (lettuce, salad greens, celery, spinach), or cooked high residue (cabbage, broccoli, etc) °o Fruit -- Fresh (unpeeled skin & pulp), Dried (prunes, apricots, cherries, etc ),  or stewed ( applesauce)  °o Whole grain breads, pasta, etc (whole wheat)  °o Bran cereals  °  Bulking Agents -- This type of water-retaining fiber generally is easily obtained each day by one of the following:  °o Psyllium bran -- The psyllium plant is remarkable because its ground seeds can retain so much water. This product is available as Metamucil, Konsyl, Effersyllium, Per Diem Fiber, or the less expensive generic preparation in drug and health food stores. Although labeled a laxative, it really is not a laxative.  °o Methylcellulose -- This is another fiber derived from wood which also retains water. It is available as Citrucel. °o Polyethylene Glycol - and “artificial” fiber commonly called Miralax or Glycolax.  It is helpful  for people with gassy or bloated feelings with regular fiber °o Flax Seed - a less gassy fiber than psyllium °  No reading or other relaxing activity while on the toilet. If bowel movements take longer than 5 minutes, you are too constipated °  AVOID CONSTIPATION.  High fiber and water intake usually takes care of this.  Sometimes a laxative is needed to stimulate more frequent bowel movements, but  °  Laxatives are not a good long-term solution as it can wear the colon out. °o Osmotics (Milk of Magnesia, Fleets phosphosoda, Magnesium citrate, MiraLax, GoLytely) are safer than  °o Stimulants (Senokot, Castor Oil, Dulcolax, Ex Lax)    °o Do not take laxatives for more than 7days in a row. °   IF SEVERELY CONSTIPATED, try a Bowel Retraining Program: °o Do not use laxatives.  °o Eat a diet high in roughage, such as bran cereals and leafy vegetables.  °o Drink six (6) ounces of prune or apricot juice each morning.  °o Eat two (2) large servings of stewed fruit each day.  °o Take one (1) heaping tablespoon of a psyllium-based bulking agent twice a day. Use sugar-free sweetener when possible to avoid excessive calories.  °o Eat a normal breakfast.  °o Set aside 15 minutes after breakfast to sit on the toilet, but do not strain to have a bowel movement.  °o If you do not have a bowel movement by the third day, use an enema and repeat the above steps.  °  Controlling diarrhea °o Switch to liquids and simpler foods for a few days to avoid stressing your intestines further. °o Avoid dairy products (especially milk & ice cream) for a short time.  The intestines often can lose the ability to digest lactose when stressed. °o Avoid foods that cause gassiness or bloating.  Typical foods include beans and other legumes, cabbage, broccoli, and dairy foods.  Every person has some sensitivity to other foods, so listen to our body and avoid those foods that trigger problems for you. °o Adding fiber (Citrucel, Metamucil, psyllium,  Miralax) gradually can help thicken stools by absorbing excess fluid and retrain the intestines to act more normally.  Slowly increase the dose over a few weeks.  Too much fiber too soon can backfire and cause cramping & bloating. °o Probiotics (such as active yogurt, Align, etc) may help repopulate the intestines and colon with normal bacteria and calm down a sensitive digestive tract.  Most studies show it to be of mild help, though, and such products can be costly. °o Medicines: °  Bismuth   subsalicylate (ex. Kayopectate, Pepto Bismol) every 30 minutes for up to 6 doses can help control diarrhea.  Avoid if pregnant.   Loperamide (Immodium) can slow down diarrhea.  Start with two tablets (4mg  total) first and then try one tablet every 6 hours.  Avoid if you are having fevers or severe pain.  If you are not better or start feeling worse, stop all medicines and call your doctor for advice o Call your doctor if you are getting worse or not better.  Sometimes further testing (cultures, endoscopy, X-ray studies, bloodwork, etc) may be needed to help diagnose and treat the cause of the diarrhea.  Peptic Ulcer A peptic ulcer is a sore in the lining of in your esophagus (esophageal ulcer), stomach (gastric ulcer), or in the first part of your small intestine (duodenal ulcer). The ulcer causes erosion into the deeper tissue. CAUSES  Normally, the lining of the stomach and the small intestine protects itself from the acid that digests food. The protective lining can be damaged by:  An infection caused by a bacterium called Helicobacter pylori (H. pylori).  Regular use of nonsteroidal anti-inflammatory drugs (NSAIDs), such as ibuprofen or aspirin.  Smoking tobacco. Other risk factors include being older than 56, drinking alcohol excessively, and having a family history of ulcer disease.  SYMPTOMS   Burning pain or gnawing in the area between the chest and the belly button.  Heartburn.  Nausea and  vomiting.  Bloating. The pain can be worse on an empty stomach and at night. If the ulcer results in bleeding, it can cause:  Black, tarry stools.  Vomiting of bright red blood.  Vomiting of coffee ground looking materials. DIAGNOSIS  A diagnosis is usually made based upon your history and an exam. Other tests and procedures may be performed to find the cause of the ulcer. Finding a cause will help determine the best treatment. Tests and procedures may include:  Blood tests, stool tests, or breath tests to check for the bacterium H. pylori.  An upper gastrointestinal (GI) series of the esophagus, stomach, and small intestine.  An endoscopy to examine the esophagus, stomach, and small intestine.  A biopsy. TREATMENT  Treatment may include:  Eliminating the cause of the ulcer, such as smoking, NSAIDs, or alcohol.  Medicines to reduce the amount of acid in your digestive tract.  Antibiotic medicines if the ulcer is caused by the H. pylori bacterium.  An upper endoscopy to treat a bleeding ulcer.  Surgery if the bleeding is severe or if the ulcer created a hole somewhere in the digestive system. HOME CARE INSTRUCTIONS   Avoid tobacco, alcohol, and caffeine. Smoking can increase the acid in the stomach, and continued smoking will impair the healing of ulcers.  Avoid foods and drinks that seem to cause discomfort or aggravate your ulcer.  Only take medicines as directed by your caregiver. Do not substitute over-the-counter medicines for prescription medicines without talking to your caregiver.  Keep any follow-up appointments and tests as directed. SEEK MEDICAL CARE IF:   Your do not improve within 7 days of starting treatment.  You have ongoing indigestion or heartburn. SEEK IMMEDIATE MEDICAL CARE IF:   You have sudden, sharp, or persistent abdominal pain.  You have bloody or dark black, tarry stools.  You vomit blood or vomit that looks like coffee grounds.  You  become light headed, weak, or feel faint.  You become sweaty or clammy. MAKE SURE YOU:   Understand these instructions.  Will watch  your condition.  Will get help right away if you are not doing well or get worse. Document Released: 06/20/2000 Document Revised: 03/17/2012 Document Reviewed: 01/21/2012 [facility] Patient Information 2014 [place].

## 2013-11-11 NOTE — Progress Notes (Signed)
Adv diet as tolerated With WBC WNL & bowel fx returned, agree w stop ABx

## 2013-11-12 NOTE — Progress Notes (Signed)
Patient ID: Belinda Nielsen, female   DOB: [birth year], 63 y.o.   MRN: [id number] [facility] Surgery Progress Note:   7 Days Post-Op  Subjective: Mental status is clear.  Planning to go home today Objective: Vital signs in last 24 hours: Temp:  [97.7 F (36.5 C)-98.2 F (36.8 C)] 98.2 F (36.8 C) (05/09 0535) Pulse Rate:  [79-94] 84 (05/09 0535) Resp:  [18-20] 20 (05/09 0535) BP: (111-112)/(67-72) 112/71 mmHg (05/09 0535) SpO2:  [95 %-96 %] 96 % (05/09 0535)  Intake/Output from previous day: 05/08 0701 - 05/09 0700 In: 1260 [P.O.:360; I.V.:900] Out: -  Intake/Output this shift:    Physical Exam: Work of breathing is normal.  Incision bland.    Lab Results:  No results found for this or any previous visit (from the past 48 hour(s)).  Radiology/Results: Dg Abd 2 Views  11/10/2013   CLINICAL DATA:  post op perforated GJ  EXAM: ABDOMEN - 2 VIEW  COMPARISON:  CT ABD/PELVIS W CM dated 11/05/2013  FINDINGS: The enteric tube coursing in the expected region of the stomach when correlated with CT. Air is seen within nondilated loops of bowel. There is no evidence of free air. Postsurgical changes with clips and sutures in the abdomen. Levoscoliosis of the thoracolumbar spine and degenerative disc disease changes in the lower lumbar spine.  IMPRESSION: Nonobstructive bowel gas pattern.   Electronically Signed   By: Margaree Mackintosh M.D.   On: 11/10/2013 08:29    Anti-infectives: Anti-infectives   Start     Dose/Rate Route Frequency Ordered Stop   11/06/13 2200  ertapenem (INVANZ) 1 g in sodium chloride 0.9 % 50 mL IVPB     1 g 100 mL/hr over 30 Minutes Intravenous Daily at bedtime 11/06/13 0145 11/11/13 2142   11/06/13 0600  ertapenem (INVANZ) 1 g in sodium chloride 0.9 % 50 mL IVPB     1 g 100 mL/hr over 30 Minutes Intravenous On call to O.R. 11/05/13 2157 11/05/13 2205      Assessment/Plan: Problem List: Patient Active Problem List   Diagnosis Date Noted  . 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 Primary neuroendocrine tumor of pancreas s/p Whipple 2012 11/07/2013  . Perforation of gastrojejeunostomy s/p resection & of Roux en Y gastrojejunostomy 11/05/13 11/05/2013  . Bunion of great toe of right foot 05/12/2013  . Midfoot collapse 05/12/2013    Discharge if taking solid diet.   7 Days Post-Op    LOS: 7 days   Matt B. Hassell Done, MD, [facility] [phone] beeper [phone]  11/12/2013 7:52 AM

## 2013-11-17 ENCOUNTER — Encounter (INDEPENDENT_AMBULATORY_CARE_PROVIDER_SITE_OTHER): Payer: BC Managed Care – PPO

## 2013-11-30 ENCOUNTER — Encounter (INDEPENDENT_AMBULATORY_CARE_PROVIDER_SITE_OTHER): Payer: Self-pay

## 2013-11-30 ENCOUNTER — Encounter (INDEPENDENT_AMBULATORY_CARE_PROVIDER_SITE_OTHER): Payer: Self-pay | Admitting: Surgery

## 2013-11-30 ENCOUNTER — Ambulatory Visit (INDEPENDENT_AMBULATORY_CARE_PROVIDER_SITE_OTHER): Payer: BC Managed Care – PPO | Admitting: Surgery

## 2013-11-30 VITALS — BP 110/65 | HR 61 | Temp 97.5°F | Resp 14 | Ht 65.0 in | Wt 116.6 lb

## 2013-11-30 DIAGNOSIS — R69 Illness, unspecified: Secondary | ICD-10-CM

## 2013-11-30 DIAGNOSIS — R198 Other specified symptoms and signs involving the digestive system and abdomen: Secondary | ICD-10-CM

## 2013-11-30 DIAGNOSIS — K289 Gastrojejunal ulcer, unspecified as acute or chronic, without hemorrhage or perforation: Secondary | ICD-10-CM

## 2013-11-30 NOTE — Progress Notes (Signed)
General Surgery [facility]  Chief Complaint  Patient presents with  . Follow-up    revision of perforated gastrojejunostomy on 11/05/2013    HISTORY: Patient is a 63 year old female who underwent an emergency laparotomy for perforated gastrojejunostomy on 11/05/2013. She was reconstructed with a Roux-en-Y gastrojejunostomy. Postoperative course has been relatively uncomplicated. She is doing well and tolerating a regular diet.  EXAM: Abdominal incision is healed nicely. No sign of infection. No sign of herniation. Abdomen is soft and nontender without mass.  IMPRESSION: Perforated gastrojejunostomy, status post exploratory laparotomy and revision.  PLAN: Patient will return to work in 3 weeks. I've asked her to refrain from exercise or heavy lifting for an additional 3 weeks.  Patient should remain on proton pump inhibitors for life. I would recommend a followup upper endoscopy in one year.  Patient will return for surgical care as needed.  Earnstine Regal, MD, [facility]   Visit Diagnoses: 1. Perforation of gastrojejeunostomy s/p resection & of Roux en Y gastrojejunostomy 11/05/13   2. 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

## 2013-11-30 NOTE — Patient Instructions (Signed)
  CARE OF INCISION   Apply cocoa butter/vitamin E cream (Palmer's brand) to your incision 2 - 3 times daily.  Massage cream into incision for one minute with each application.  Use sunscreen (50 SPF or higher) for first 6 months after surgery if area is exposed to sun.  You may alternate Mederma or other scar reducing cream with cocoa butter cream if desired.       Carlei Huang M. Latwan Luchsinger, MD, FACS      [facility]      Office: [phone]    

## 2013-12-01 NOTE — Discharge Summary (Signed)
Patient ID: Belinda Nielsen MRN: [id number] DOB/AGE: September 11, 1950 62 y.o.  Admit date: 11/05/2013 Discharge date: 11/12/2013  Procedures: 1. Exploratory laparotomy.  2. Lysis of adhesions (30 minutes).  3. Resection of gastrojejunostomy.  4. Creation of a Roux-en-Y gastrojejunostomy with jejunojejunostomy.  Consults: None  Reason for Admission: patient is a 63 yo WF who presents to the ER with sudden onset of abdominal pain. Pain is diffuse, severe, worse with movement. No BM today. Takes laxatives.  History of pancreatic endocrine neoplasm resection at [facility] in 2012 by Dr. Candis Shine. 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 History of cholecystectomy in [facility].  Denies other medical problems. Denies prescription medications.  Works for [employer].  WBC elevated at 18K with left shift. CT abdomen shows free air consistent with perforated viscus. Ascites is present. Small bowel loops appear thickened and possibly ischemic.  Admission Diagnoses:  1. Perforated viscus, possible small bowel ischemia 2. Pancreatic cancer  Hospital Course: The patient was admitted and taken to the OR for the above listed procedure.  She was placed on IV Invanz.  An NGT was left in post procedure.  She had a Postop ileus for about 5 days.  She began having some diarrhea on POD 5 and her NGT was removed and she was started on clear liquids.  Her diet was then able to be advanced as tolerated after this time.  She was doing well and was stable for dc home on POD 7.  No further abx therapy was needed as she completed her full 7 day course in the hospital.  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 Take daily for 21 days then do not take for 7 days.     multivitamin with minerals tablet  Take 1 tablet by mouth daily.     pantoprazole 40 MG tablet  Commonly known as:  PROTONIX  Take 1 tablet (40 mg total) by mouth daily.        Discharge Instructions:     Follow-up Information   Follow up with [facility] On 11/17/2013. (10:30am , For suture removal with a nurse only)    Contact information:   [place] [phone]      Follow up with Earnstine Regal, MD. Schedule an appointment as soon as possible for a visit in 3 weeks.   Specialty:  General Surgery   Contact information:   [address] [place] [address] [phone]       Signed: Henreitta Cea 12/01/2013, 9:37 AM

## 2013-12-06 ENCOUNTER — Telehealth (INDEPENDENT_AMBULATORY_CARE_PROVIDER_SITE_OTHER): Payer: Self-pay

## 2013-12-06 ENCOUNTER — Other Ambulatory Visit (INDEPENDENT_AMBULATORY_CARE_PROVIDER_SITE_OTHER): Payer: Self-pay | Admitting: Surgery

## 2013-12-06 NOTE — Telephone Encounter (Signed)
Pt s/p emergency laparotomy for perforated gastrojejunostomy on 11/05/2013.Pt states that she is almost out of her protonix and she would need a refill. Pt states that Dr Harlow Asa stated she would need to stay on this medication.  Pt states that her insurance pays for a 90 day supply and wants to know if Dr Harlow Asa can call in a 90 day supply to her pharmacy. Informed pt that I would send Dr Harlow Asa a message and we would get back in touch with her and let her know. Pt verbalized understanding and agrees with POC.

## 2013-12-06 NOTE — Telephone Encounter (Signed)
Reviewed with Dr Harlow Asa. Pt advised per Dr Gala Lewandowsky request that she has 2 refills in system and she needs to contact Dr Celedonio Miyamoto or her pcp for future refills. Pt states she understands and will do so.

## 2014-01-30 ENCOUNTER — Other Ambulatory Visit (HOSPITAL_COMMUNITY): Payer: Self-pay | Admitting: Surgery

## 2015-04-18 ENCOUNTER — Encounter: Payer: Self-pay | Admitting: Sports Medicine

## 2015-04-25 ENCOUNTER — Encounter: Payer: Self-pay | Admitting: Sports Medicine

## 2016-01-04 IMAGING — CT CT ABD-PELV W/ CM
1 of 3 series · 14 of 32 positions shown, 19 images · IV contrast (omnipaque)
Comparison: None.

CLINICAL DATA: Pain. History pancreatic cancer. Prior history of
Whipple surgery. Appendectomy, hysterectomy cul colon resection,
cholecystectomy.

EXAM:
CT ABDOMEN AND PELVIS WITH CONTRAST
TECHNIQUE: Multidetector CT imaging of the abdomen and pelvis was performed
using the standard protocol following bolus administration of
intravenous contrast.
CONTRAST:  50mL OMNIPAQUE IOHEXOL 300 MG/ML SOLN, 100mL OMNIPAQUE
IOHEXOL 300 MG/ML SOLN

[Series 2: abd/pel with · axial · 0.62mm/px · z∈[-476,-76]mm · 14 of 90 slices shown, 19 images]
[im 5/90  soft-tissue]
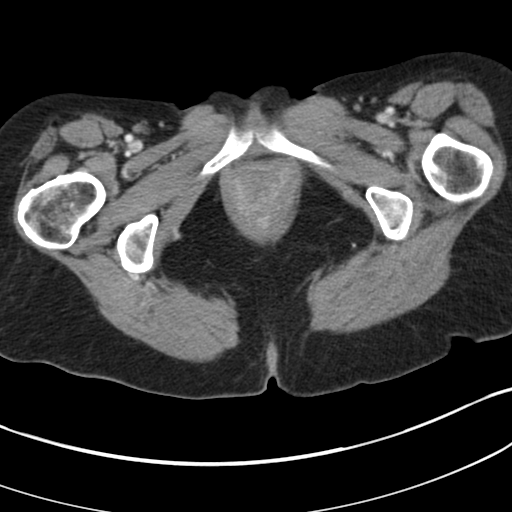
[im 5/90  bone]
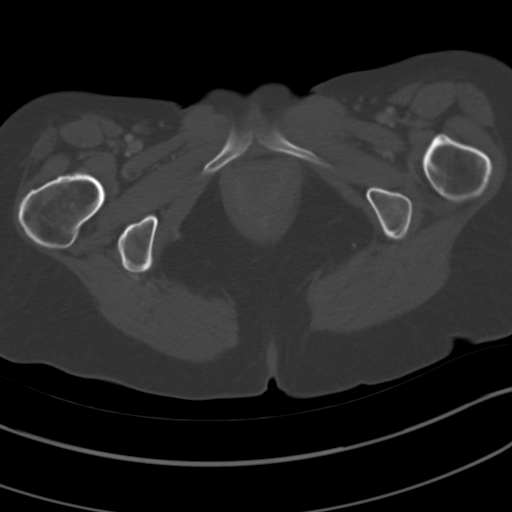
[im 15/90  soft-tissue]
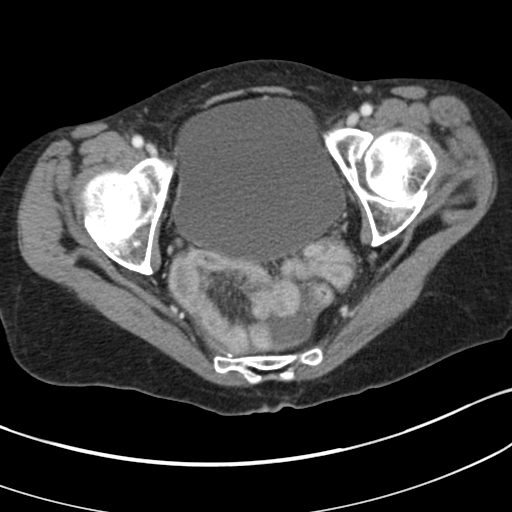
[im 19/90  soft-tissue]
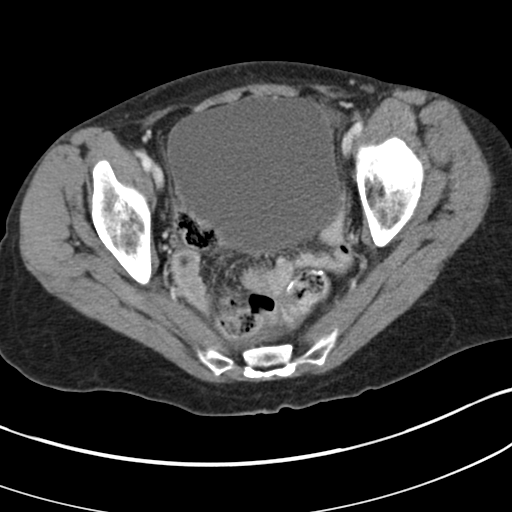
[im 24/90  soft-tissue]
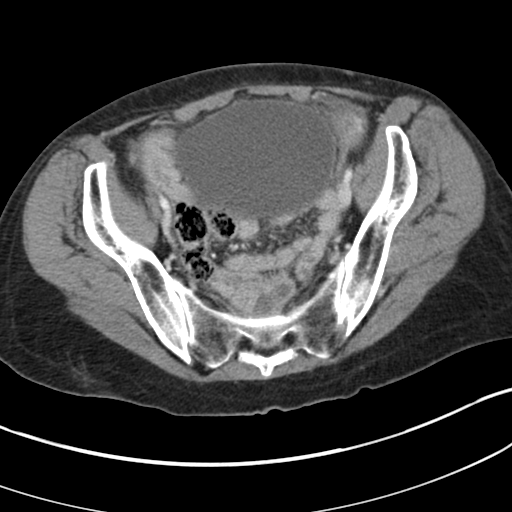
[im 33/90  soft-tissue]
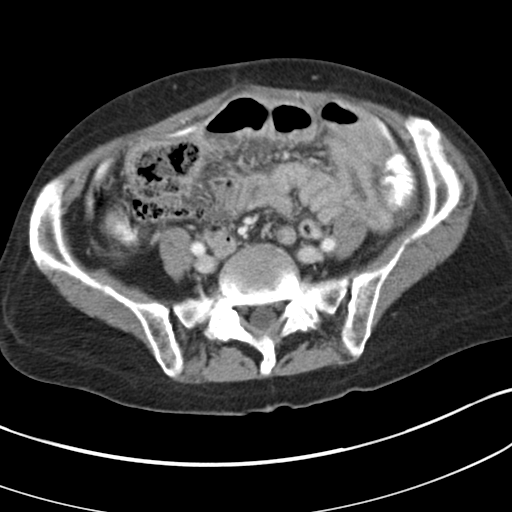
[im 38/90  soft-tissue]
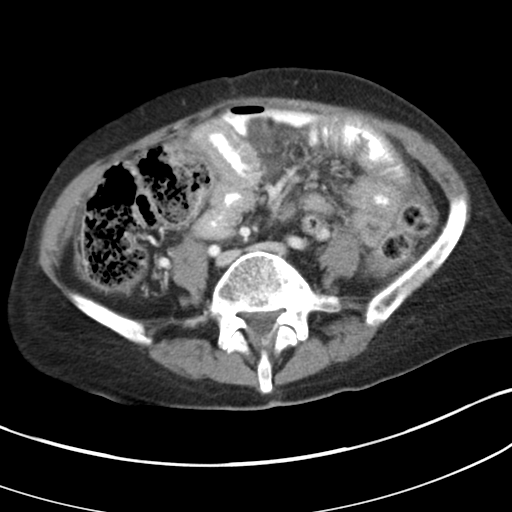
[im 47/90  soft-tissue]
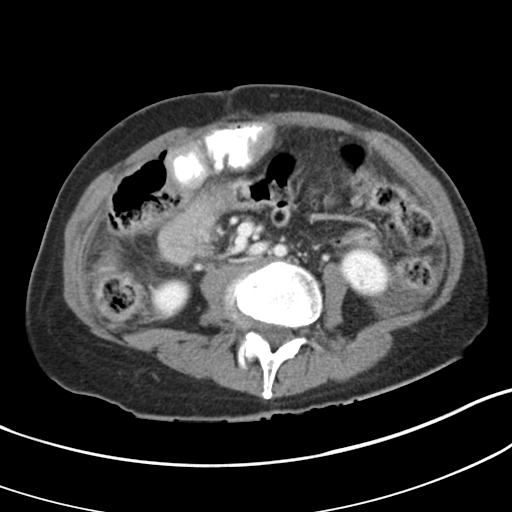
[im 52/90  soft-tissue]
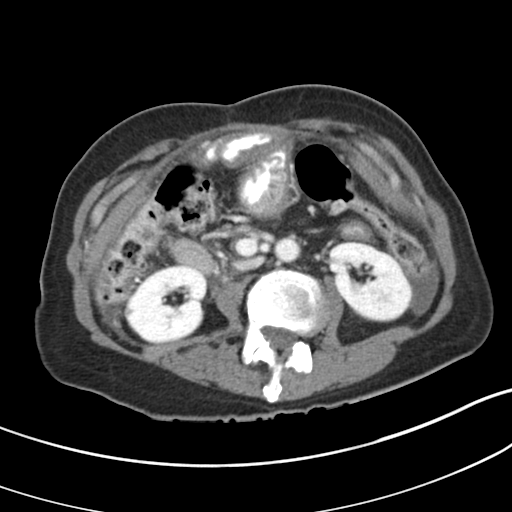
[im 57/90  soft-tissue]
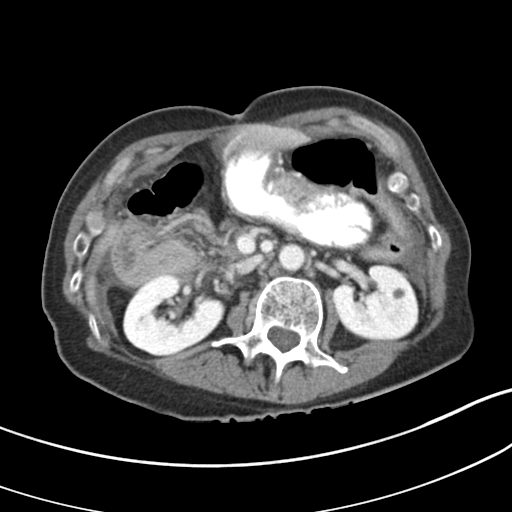
[im 57/90  bone]
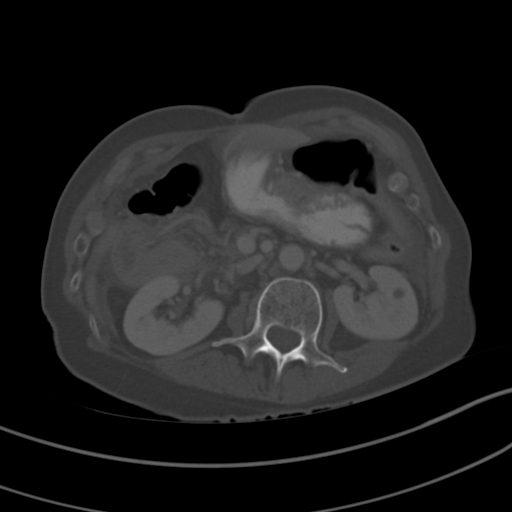
[im 66/90  soft-tissue]
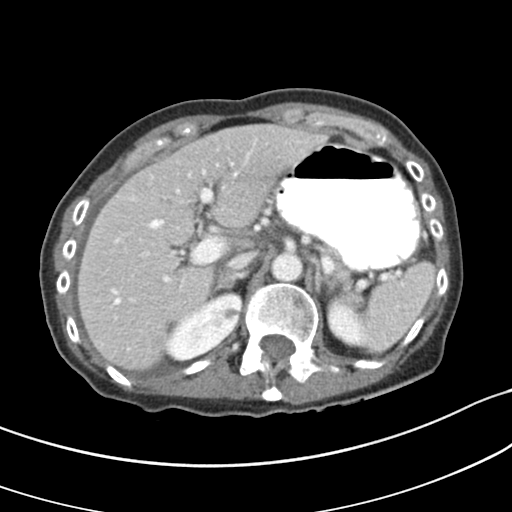
[im 71/90  soft-tissue]
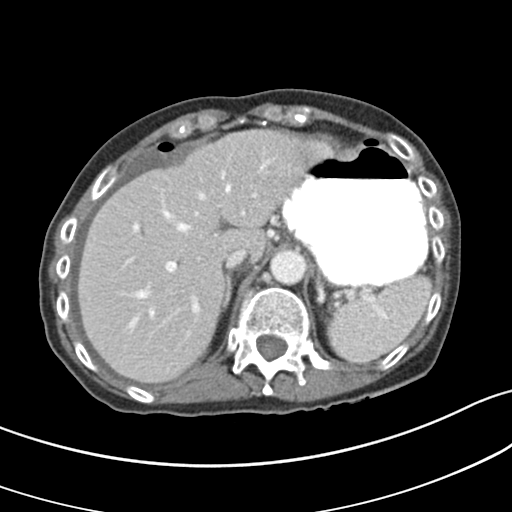
[im 71/90  lung]
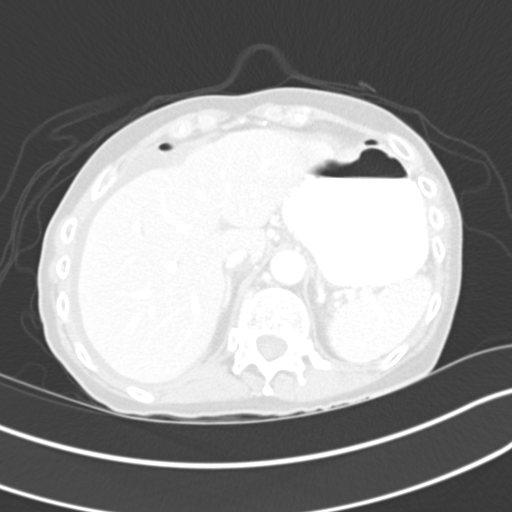
[im 75/90  soft-tissue]
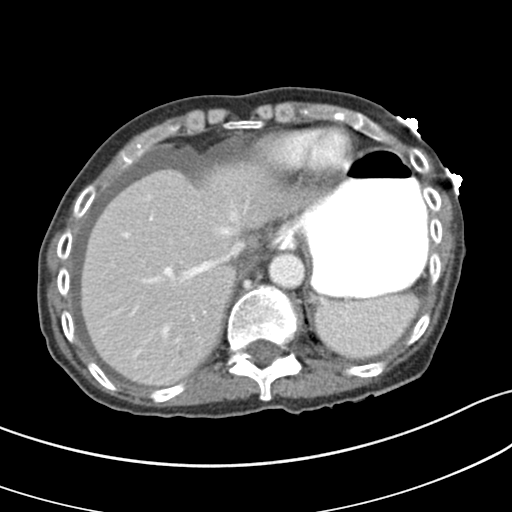
[im 75/90  lung]
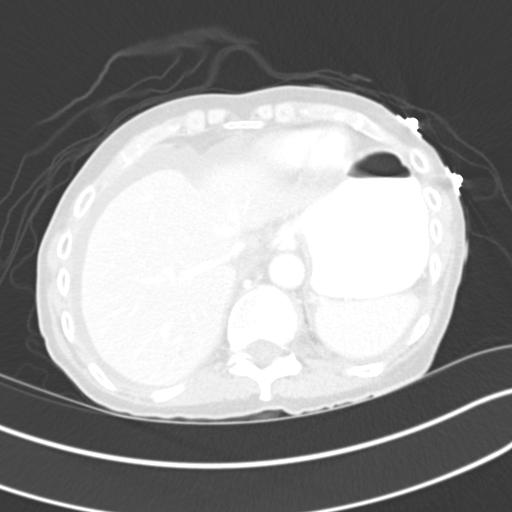
[im 80/90  lung]
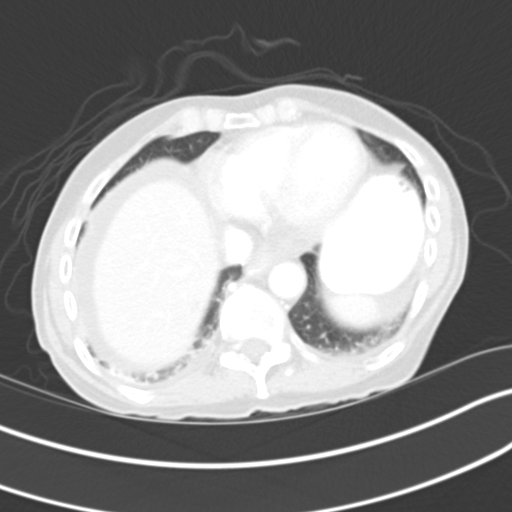
[im 85/90  soft-tissue]
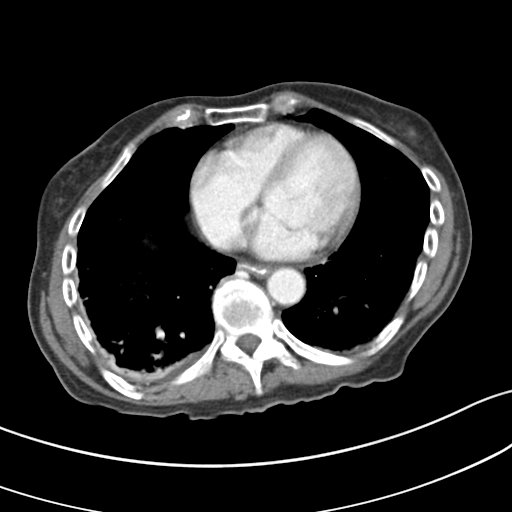
[im 85/90  lung]
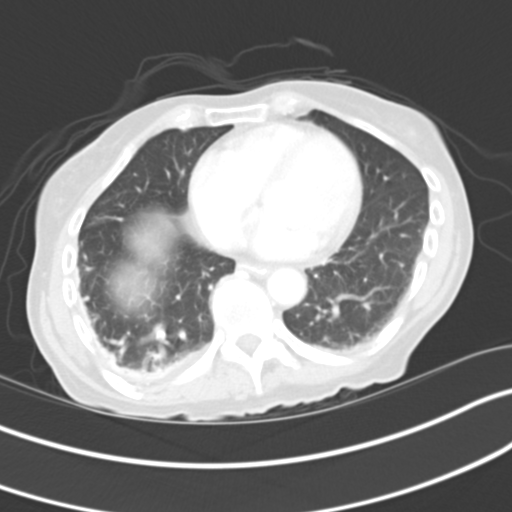

[14 of 32 positions shown; findings below may reference images not displayed]

FINDINGS: Multiple small hepatic cysts are noted. Liver is otherwise
unremarkable. Spleen is normal. Pancreas unremarkable. Surgical
clips in the gallbladder fossa. No biliary distention.

Adrenals are unremarkable. Simple renal cysts are present. No
hydronephrosis. Mild bladder distention. Hysterectomy. No pelvic
mass. Mild ascites.

No adenopathy. The abdominal aorta is widely patent. Visceral
vessels are patent. Portal vein and splenic vein patent. Hepatic
veins patent.

Free intraperitoneal air is noted. Thickened loops of small bowel
with proximal small bowel distention noted. Distal bowel is
nondistended. The colon is nondistended. Small bowel obstruction
cannot be excluded. Small bowel inflammation and/or ischemia may be
present. Given free air, small bowel perforation may be present.
Postsurgical changes noted in the of rectosigmoid region. Stool is
present in the colon.

Heart size normal. Basilar atelectasis and/or mild infiltrates.
Small pleural effusions. Degenerative changes lumbar spine with
severe scoliosis concave right. Degenerative changes both hips.
IMPRESSION: 1. Distended loops of proximal small bowel with severe bowel wall
thickening. This suggests ischemia and/or inflammatory bowel.
Proximal small bowel obstruction cannot be excluded .

2. Free intraperitoneal air. Bowel perforation could present in this
fashion.

3. Ascites.

These results were called by telephone at the time of interpretation
on 11/05/2013 at [DATE] to CHAI TIGER , who verbally
acknowledged these results.

## 2017-04-23 ENCOUNTER — Encounter: Payer: Self-pay | Admitting: Sports Medicine

## 2017-05-12 ENCOUNTER — Encounter: Payer: Self-pay | Admitting: Sports Medicine

## 2017-06-02 ENCOUNTER — Encounter: Payer: Self-pay | Admitting: Sports Medicine

## 2017-06-18 ENCOUNTER — Encounter: Payer: Self-pay | Admitting: Sports Medicine

## 2017-06-18 ENCOUNTER — Ambulatory Visit: Payer: BLUE CROSS/BLUE SHIELD | Admitting: Sports Medicine

## 2017-06-18 DIAGNOSIS — M216X9 Other acquired deformities of unspecified foot: Secondary | ICD-10-CM

## 2017-06-18 DIAGNOSIS — R269 Unspecified abnormalities of gait and mobility: Secondary | ICD-10-CM

## 2017-06-18 DIAGNOSIS — M4120 Other idiopathic scoliosis, site unspecified: Secondary | ICD-10-CM | POA: Diagnosis not present

## 2017-06-18 NOTE — Assessment & Plan Note (Signed)
WE need to keep her in orthotics or shoes that support midfoot Long arxh is flattened  Custom orthotic made today and shifted position to neurtral

## 2017-06-18 NOTE — Progress Notes (Signed)
   Subjective:    Patient ID: Belinda Nielsen, female    DOB: Aug 11, 1950, 66 y.o.   MRN: [id number]  HPI Belinda Nielsen is a 66 year old female with past medical history significant for scoliosis who presents for evaluation of neck pain.  Patient states that her neck pain stems from her scoliosis but also her malpositioned feet and gait abnormality. She had orthotics made in the past which seemed to greatly help her neck pain.  Until recently the past several months or so she tolerated her current orthotics well however today she believes that she needs new orthotics because of re-presentation of neck pain and inability to wear orthotics more than 2 hours before more pain/  Review of Systems Constitutional: Negative MSK: Positive neck pain, see HPI    Objective:   Physical Exam General: Caucasian female in no acute distress, thin BP (!) 124/56   Ht 5\' 5"  (1.651 m)   Wt 116 lb (52.6 kg)   BMI 19.30 kg/m   MSK: Levoscoliosis of the thoracic spine noted, paraspinal muscle spasm noted on the left in the lumbar region,  Feet - loss of long arch bilateralluy Subluxation at subtalar joint on RT with turnout of rt foot Some flattening of transverse arch Bunion with some valgus shift RT  Gait is pronated Drop of left shoulder Neck is shifted left from midline     Assessment & Plan:  #Orthotics -Patient's malpositioned feet and levoscoliosis because her neck pain that is corrected by orthotics. --Custom orthotics fashioned today in office.  Old orthotics adjusted and are okay for current use as well. See procedure note below.  -- Patient was fitted for a : standard, cushioned, semi-rigid orthotic. The orthotic was heated and afterward the patient stood on the orthotic blank positioned on the orthotic stand. The patient was positioned in subtalar neutral position and 10 degrees of ankle dorsiflexion in a weight bearing stance. After completion of molding, a stable base was applied to the orthotic  blank. The blank was ground to a stable position for weight bearing. Size 8 red EVA Blue medium density EVA base  After completion patient walked with less pronation and better comfort.  Some improvement in position of neck and spine   Zella Richer, MD  I observed and examined the patient with the resident and agree with assessment and plan.  Note reviewed and modified by me. Stefanie Libel, MD

## 2018-01-03 ENCOUNTER — Emergency Department (HOSPITAL_BASED_OUTPATIENT_CLINIC_OR_DEPARTMENT_OTHER)
Admission: EM | Admit: 2018-01-03 | Discharge: 2018-01-04 | Disposition: A | Discharge status: Home / Self Care | Payer: Medicare Other | Attending: Emergency Medicine | Admitting: Emergency Medicine

## 2018-01-03 ENCOUNTER — Encounter (HOSPITAL_BASED_OUTPATIENT_CLINIC_OR_DEPARTMENT_OTHER): Payer: Self-pay | Admitting: Emergency Medicine

## 2018-01-03 ENCOUNTER — Other Ambulatory Visit: Payer: Self-pay

## 2018-01-03 DIAGNOSIS — Z8507 Personal history of malignant neoplasm of pancreas: Secondary | ICD-10-CM | POA: Insufficient documentation

## 2018-01-03 DIAGNOSIS — Z79899 Other long term (current) drug therapy: Secondary | ICD-10-CM | POA: Diagnosis not present

## 2018-01-03 DIAGNOSIS — E860 Dehydration: Secondary | ICD-10-CM | POA: Diagnosis not present

## 2018-01-03 DIAGNOSIS — R197 Diarrhea, unspecified: Secondary | ICD-10-CM

## 2018-01-03 LAB — CBC WITH DIFFERENTIAL/PLATELET
Basophils Absolute: 0 10*3/uL (ref 0.0–0.1)
Basophils Relative: 0 %
Eosinophils Absolute: 0 10*3/uL (ref 0.0–0.7)
Eosinophils Relative: 0 %
HCT: 42 % (ref 36.0–46.0)
Hemoglobin: 14.1 g/dL (ref 12.0–15.0)
Lymphocytes Relative: 9 %
Lymphs Abs: 1 10*3/uL (ref 0.7–4.0)
MCH: 30.3 pg (ref 26.0–34.0)
MCHC: 33.6 g/dL (ref 30.0–36.0)
MCV: 90.3 fL (ref 78.0–100.0)
Monocytes Absolute: 0.4 10*3/uL (ref 0.1–1.0)
Monocytes Relative: 3 %
NEUTROS PCT: 88 %
Neutro Abs: 9.7 10*3/uL — ABNORMAL HIGH (ref 1.7–7.7)
Platelets: 298 10*3/uL (ref 150–400)
RBC: 4.65 MIL/uL (ref 3.87–5.11)
RDW: 12.2 % (ref 11.5–15.5)
WBC: 11.2 10*3/uL — AB (ref 4.0–10.5)

## 2018-01-03 NOTE — ED Triage Notes (Signed)
Patient states that she has had Diarrhea since Friday - she also reports sharp abdominal cramping. She reports that is has gotten better since Friday but reports that when she was getting ready to go to bed and she has 3 episodes and became weaker

## 2018-01-03 NOTE — ED Provider Notes (Signed)
[facility] EMERGENCY DEPARTMENT Provider Note   CSN: [id number] Arrival date & time: 01/03/18  2053     History   Chief Complaint Chief Complaint  Patient presents with  . Diarrhea    HPI Belinda Nielsen is a 67 y.o. female with a past medical history of pancreatic cancer, reflux, status post Roux-en-Y, Whipple procedure.  The patient presents the emergency department chief complaint of left lower quadrant abdominal pain and diarrhea.  Patient states that she developed pain in her left lower quadrant 2 days ago and thought it might be a pulled muscle.  She states that soon after she developed watery diarrhea.  She has had multiple episodes of watery diarrhea without nausea, fever or vomiting since Friday.  She states that this morning she was feeling better and went to church however when she returned she started having more diarrhea again and pain in her left lower quadrant.  She states that she has had both diverticulitis and she has had C. difficile colitis in the past and she states that this feels more like C. difficile however she has not been on any recent antibiotics and cannot think of voice she might of gotten it.  She states that this evening she felt weak and diaphoretic.  She has been on clear liquids for the past 2 days.  HPI  Past Medical History:  Diagnosis Date  . Bladder prolapse, female, acquired   . Cancer [facility])    pancreatic  . GERD (gastroesophageal reflux disease)   . Osteoporosis     Patient Active Problem List   Diagnosis Date Noted  . Abnormality of gait 06/18/2017  . Scoliosis (and kyphoscoliosis), idiopathic 06/18/2017  . 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 Primary neuroendocrine tumor of pancreas s/p Whipple 2012 11/07/2013  . Perforation of gastrojejeunostomy s/p resection & of Roux en Y gastrojejunostomy 11/05/13 11/05/2013  . Bunion of great toe of right foot 05/12/2013  . Midfoot collapse 05/12/2013     Past Surgical History:  Procedure Laterality Date  . ABDOMINAL HYSTERECTOMY    . APPENDECTOMY    . CHOLECYSTECTOMY    . COLON RESECTION    . LAPAROTOMY N/A 11/05/2013   Procedure: EXPLORATORY LAPAROTOMY;  Surgeon: Earnstine Regal, MD;  Location: [facility] ORS;  Service: General;  Laterality: N/A;  Resection of gastrojejeunostomy and construction of roux-en-y gastrojejeunostomy  . LYSIS OF ADHESION N/A 11/05/2013   Procedure: LYSIS OF ADHESION;  Surgeon: Earnstine Regal, MD;  Location: [facility] ORS;  Service: General;  Laterality: N/A;  . PANCREATICODUODENECTOMY  07/24/2010   Dr Crisoforo Oxford, [facility]     OB History   None      Home Medications    Prior to Admission medications   Medication Sig Start Date End Date Taking? Authorizing Provider  ALPRAZolam Duanne Moron) 0.5 MG tablet Take 0.5 mg by mouth.    [provider]  estrogens, conjugated, (PREMARIN) 0.625 MG tablet Take 0.625 mg by mouth daily. Take daily for 21 days then do not take for 7 days.    [provider]  methocarbamol (ROBAXIN) 500 MG tablet Take 500 mg by mouth. 05/03/13   [provider]  Multiple Vitamins-Minerals (MULTIVITAMIN WITH MINERALS) tablet Take 1 tablet by mouth daily.    [provider]  pantoprazole (PROTONIX) 40 MG tablet Take 1 tablet (40 mg total) by mouth daily. 11/11/13   Michael Boston, MD  zolpidem (AMBIEN) 10 MG tablet Take 10 mg by mouth.  [provider]    Family History History reviewed. No pertinent family history.  Social History Social History   Tobacco Use  . Smoking status: Never Smoker  . Smokeless tobacco: Never Used  Substance Use Topics  . Alcohol use: Yes    Comment: rarely  . Drug use: No     Allergies   Nsaids   Review of Systems Review of Systems Ten systems reviewed and are negative for acute change, except as noted in the HPI.    Physical Exam Updated Vital Signs BP 135/73 (BP Location: Left Arm)   Pulse 75   Temp 97.7 F (36.5 C)  (Oral)   Resp 18   Ht 5\' 5"  (1.651 m)   Wt 51.3 kg (113 lb)   SpO2 100%   BMI 18.80 kg/m   Physical Exam  Constitutional: She is oriented to person, place, and time. She appears well-developed and well-nourished. No distress.  HENT:  Head: Normocephalic and atraumatic.  Eyes: Conjunctivae are normal. No scleral icterus.  Neck: Normal range of motion.  Cardiovascular: Normal rate, regular rhythm and normal heart sounds. Exam reveals no gallop and no friction rub.  No murmur heard. Pulmonary/Chest: Effort normal and breath sounds normal. No respiratory distress.  Abdominal: Soft. Bowel sounds are normal. She exhibits no distension and no mass. There is no tenderness. There is no guarding.  Neurological: She is alert and oriented to person, place, and time.  Skin: Skin is warm and dry. She is not diaphoretic.  Psychiatric: Her behavior is normal.  Nursing note and vitals reviewed.    ED Treatments / Results  Labs (all labs ordered are listed, but only abnormal results are displayed) Labs Reviewed  C DIFFICILE QUICK SCREEN W PCR REFLEX  GASTROINTESTINAL PANEL BY PCR, STOOL (REPLACES STOOL CULTURE)  CBC WITH DIFFERENTIAL/PLATELET  COMPREHENSIVE METABOLIC PANEL  LIPASE, BLOOD  URINALYSIS, ROUTINE W REFLEX MICROSCOPIC    EKG None  Radiology No results found.  Procedures Procedures (including critical care time)  Medications Ordered in ED Medications - No data to display   Initial Impression / Assessment and Plan / ED Course  I have reviewed the triage vital signs and the nursing notes.  Pertinent labs & imaging results that were available during my care of the patient were reviewed by me and considered in my medical decision making (see chart for details).     Work-up still pending.  I have given sign out to Dr. Dina Rich.  She has a benign abdominal exam.  Final Clinical Impressions(s) / ED Diagnoses   Final diagnoses:  None    ED Discharge Orders    None        Margarita Mail, PA-C 01/04/18 0032    Merryl Hacker, MD 01/04/18 [phone]

## 2018-01-03 NOTE — Discharge Instructions (Addendum)
You were seen today for diarrhea.  New we treated for diverticulitis which will also treat for C. difficile colitis.  Stool studies are pending.  You will be called with any positive results.  If you develop pain, worsening signs or symptoms of dehydration including dizziness, or any new or worsening symptoms you should be reevaluated immediately.

## 2018-01-04 DIAGNOSIS — R197 Diarrhea, unspecified: Secondary | ICD-10-CM | POA: Diagnosis not present

## 2018-01-04 LAB — GASTROINTESTINAL PANEL BY PCR, STOOL (REPLACES STOOL CULTURE)
ADENOVIRUS F40/41: NOT DETECTED
Astrovirus: NOT DETECTED
Campylobacter species: NOT DETECTED
Cryptosporidium: NOT DETECTED
Cyclospora cayetanensis: NOT DETECTED
ENTEROPATHOGENIC E COLI (EPEC): NOT DETECTED
Entamoeba histolytica: NOT DETECTED
Enteroaggregative E coli (EAEC): NOT DETECTED
Enterotoxigenic E coli (ETEC): NOT DETECTED
Giardia lamblia: NOT DETECTED
Norovirus GI/GII: NOT DETECTED
PLESIMONAS SHIGELLOIDES: NOT DETECTED
Rotavirus A: NOT DETECTED
Salmonella species: NOT DETECTED
Sapovirus (I, II, IV, and V): NOT DETECTED
Shiga like toxin producing E coli (STEC): NOT DETECTED
Shigella/Enteroinvasive E coli (EIEC): NOT DETECTED
VIBRIO SPECIES: NOT DETECTED
Vibrio cholerae: NOT DETECTED
Yersinia enterocolitica: NOT DETECTED

## 2018-01-04 LAB — URINALYSIS, ROUTINE W REFLEX MICROSCOPIC
Bilirubin Urine: NEGATIVE
GLUCOSE, UA: NEGATIVE mg/dL
Ketones, ur: >80 mg/dL — AB
Leukocytes, UA: NEGATIVE
Nitrite: NEGATIVE
PROTEIN: NEGATIVE mg/dL
Specific Gravity, Urine: 1.025 (ref 1.005–1.030)
pH: 5.5 (ref 5.0–8.0)

## 2018-01-04 LAB — C DIFFICILE QUICK SCREEN W PCR REFLEX
C DIFFICLE (CDIFF) ANTIGEN: NEGATIVE
C Diff interpretation: NOT DETECTED
C Diff toxin: NEGATIVE

## 2018-01-04 LAB — COMPREHENSIVE METABOLIC PANEL
ALT: 20 U/L (ref 0–44)
AST: 20 U/L (ref 15–41)
Albumin: 4.3 g/dL (ref 3.5–5.0)
Alkaline Phosphatase: 79 U/L (ref 38–126)
Anion gap: 15 (ref 5–15)
BILIRUBIN TOTAL: 0.8 mg/dL (ref 0.3–1.2)
BUN: 10 mg/dL (ref 8–23)
CO2: 21 mmol/L — ABNORMAL LOW (ref 22–32)
CREATININE: 0.47 mg/dL (ref 0.44–1.00)
Calcium: 9.1 mg/dL (ref 8.9–10.3)
Chloride: 97 mmol/L — ABNORMAL LOW (ref 98–111)
Glucose, Bld: 94 mg/dL (ref 70–99)
Potassium: 3.5 mmol/L (ref 3.5–5.1)
Sodium: 133 mmol/L — ABNORMAL LOW (ref 135–145)
Total Protein: 7 g/dL (ref 6.5–8.1)

## 2018-01-04 LAB — URINALYSIS, MICROSCOPIC (REFLEX)

## 2018-01-04 LAB — LIPASE, BLOOD: LIPASE: 21 U/L (ref 11–51)

## 2018-01-04 MED ORDER — CIPROFLOXACIN HCL 500 MG PO TABS: 500.0000 mg | ORAL_TABLET | Freq: Two times a day (BID) | ORAL | 0 refills | Status: AC

## 2018-01-04 MED ORDER — METRONIDAZOLE 500 MG PO TABS
500.0000 mg | ORAL_TABLET | Freq: Once | ORAL | Status: AC
  Administered 2018-01-04: 500 mg via ORAL
  Filled 2018-01-04: qty 1

## 2018-01-04 MED ORDER — ONDANSETRON HCL 4 MG/2ML IJ SOLN
4.0000 mg | Freq: Once | INTRAMUSCULAR | Status: AC
  Administered 2018-01-04: 4 mg via INTRAVENOUS
  Filled 2018-01-04: qty 2

## 2018-01-04 MED ORDER — METRONIDAZOLE 500 MG PO TABS: 500.0000 mg | ORAL_TABLET | Freq: Three times a day (TID) | ORAL | 0 refills | Status: AC

## 2018-01-04 MED ORDER — SODIUM CHLORIDE 0.9 % IV BOLUS
1000.0000 mL | Freq: Once | INTRAVENOUS | Status: AC
  Administered 2018-01-04: 1000 mL via INTRAVENOUS

## 2018-01-04 MED ORDER — ONDANSETRON 4 MG PO TBDP: 4.0000 mg | ORAL_TABLET | Freq: Three times a day (TID) | ORAL | 0 refills | Status: AC | PRN

## 2018-01-04 MED ORDER — CIPROFLOXACIN IN D5W 400 MG/200ML IV SOLN
400.0000 mg | Freq: Once | INTRAVENOUS | Status: AC
  Administered 2018-01-04: 400 mg via INTRAVENOUS
  Filled 2018-01-04: qty 200

## 2018-01-04 NOTE — ED Notes (Signed)
Pt given d/c instructions as per chart. Rx x 3. Verbalizes understanding. No questions.

## 2019-01-25 ENCOUNTER — Other Ambulatory Visit: Payer: Self-pay | Admitting: Family Medicine

## 2019-01-25 DIAGNOSIS — R0789 Other chest pain: Secondary | ICD-10-CM

## 2019-02-04 ENCOUNTER — Other Ambulatory Visit: Payer: Self-pay

## 2019-02-04 ENCOUNTER — Ambulatory Visit
Admission: RE | Admit: 2019-02-04 | Discharge: 2019-02-04 | Disposition: A | Discharge status: Home / Self Care | Payer: Medicare Other | Source: Ambulatory Visit | Attending: Family Medicine | Admitting: Family Medicine

## 2019-02-04 DIAGNOSIS — R0789 Other chest pain: Secondary | ICD-10-CM

## 2019-07-27 ENCOUNTER — Ambulatory Visit: Payer: Medicare Other | Attending: Internal Medicine

## 2019-07-27 DIAGNOSIS — Z23 Encounter for immunization: Secondary | ICD-10-CM | POA: Insufficient documentation

## 2019-07-27 NOTE — Progress Notes (Signed)
   Covid-19 Vaccination Clinic  Name:  Belinda Nielsen    MRN: [id number] DOB: 21-Feb-1951  07/27/2019  [name] was observed post Covid-19 immunization for 15 minutes without incidence. She was provided with Vaccine Information Sheet and instruction to access the V-Safe system.   [name] was instructed to call 911 with any severe reactions post vaccine: Marland Kitchen Difficulty breathing  . Swelling of your face and throat  . A fast heartbeat  . A bad rash all over your body  . Dizziness and weakness    Immunizations Administered    Name Date Dose VIS Date Route   Pfizer COVID-19 Vaccine 07/27/2019  4:36 PM 0.3 mL 06/17/2019 Intramuscular   Manufacturer: [employer]   Lot: [id number]   [place]: [id number]

## 2019-08-17 ENCOUNTER — Ambulatory Visit: Payer: Medicare Other | Attending: Internal Medicine

## 2019-08-17 DIAGNOSIS — Z23 Encounter for immunization: Secondary | ICD-10-CM | POA: Insufficient documentation

## 2019-08-17 NOTE — Progress Notes (Signed)
   Covid-19 Vaccination Clinic  Name:  Belinda Nielsen    MRN: [id number] DOB: Sep 21, 1950  08/17/2019  Belinda Nielsen was observed post Covid-19 immunization for 15 minutes without incidence. She was provided with Vaccine Information Sheet and instruction to access the V-Safe system.   Belinda Nielsen was instructed to call 911 with any severe reactions post vaccine: Marland Kitchen Difficulty breathing  . Swelling of your face and throat  . A fast heartbeat  . A bad rash all over your body  . Dizziness and weakness    Immunizations Administered    Name Date Dose VIS Date Route   Pfizer COVID-19 Vaccine 08/17/2019  8:19 AM 0.3 mL 06/17/2019 Intramuscular   Manufacturer: [place]   Lot: [id number]   [place]: [id number]

## 2020-06-12 ENCOUNTER — Encounter: Payer: Medicare Other | Admitting: Sports Medicine

## 2020-06-19 ENCOUNTER — Encounter: Payer: Medicare Other | Admitting: Sports Medicine

## 2020-08-17 DIAGNOSIS — I700 Atherosclerosis of aorta: Secondary | ICD-10-CM | POA: Diagnosis not present

## 2020-08-17 DIAGNOSIS — R197 Diarrhea, unspecified: Secondary | ICD-10-CM | POA: Diagnosis not present

## 2020-08-17 DIAGNOSIS — F411 Generalized anxiety disorder: Secondary | ICD-10-CM | POA: Diagnosis not present

## 2020-08-17 DIAGNOSIS — F5101 Primary insomnia: Secondary | ICD-10-CM | POA: Diagnosis not present

## 2020-08-18 DIAGNOSIS — R197 Diarrhea, unspecified: Secondary | ICD-10-CM | POA: Diagnosis not present

## 2020-08-21 DIAGNOSIS — R197 Diarrhea, unspecified: Secondary | ICD-10-CM | POA: Diagnosis not present

## 2020-09-12 DIAGNOSIS — K289 Gastrojejunal ulcer, unspecified as acute or chronic, without hemorrhage or perforation: Secondary | ICD-10-CM | POA: Diagnosis not present

## 2020-09-12 DIAGNOSIS — D3A8 Other benign neuroendocrine tumors: Secondary | ICD-10-CM | POA: Diagnosis not present

## 2020-09-12 DIAGNOSIS — K219 Gastro-esophageal reflux disease without esophagitis: Secondary | ICD-10-CM | POA: Diagnosis not present

## 2020-09-12 DIAGNOSIS — C250 Malignant neoplasm of head of pancreas: Secondary | ICD-10-CM | POA: Diagnosis not present

## 2020-09-12 DIAGNOSIS — R1010 Upper abdominal pain, unspecified: Secondary | ICD-10-CM | POA: Diagnosis not present

## 2020-09-12 DIAGNOSIS — Z86010 Personal history of colonic polyps: Secondary | ICD-10-CM | POA: Diagnosis not present

## 2020-09-19 DIAGNOSIS — F5101 Primary insomnia: Secondary | ICD-10-CM | POA: Diagnosis not present

## 2020-09-19 DIAGNOSIS — F411 Generalized anxiety disorder: Secondary | ICD-10-CM | POA: Diagnosis not present

## 2020-09-19 DIAGNOSIS — E559 Vitamin D deficiency, unspecified: Secondary | ICD-10-CM | POA: Diagnosis not present

## 2020-09-20 DIAGNOSIS — L820 Inflamed seborrheic keratosis: Secondary | ICD-10-CM | POA: Diagnosis not present

## 2020-10-10 DIAGNOSIS — K5730 Diverticulosis of large intestine without perforation or abscess without bleeding: Secondary | ICD-10-CM | POA: Diagnosis not present

## 2020-10-10 DIAGNOSIS — K317 Polyp of stomach and duodenum: Secondary | ICD-10-CM | POA: Diagnosis not present

## 2020-10-10 DIAGNOSIS — R109 Unspecified abdominal pain: Secondary | ICD-10-CM | POA: Diagnosis not present

## 2020-10-10 DIAGNOSIS — K635 Polyp of colon: Secondary | ICD-10-CM | POA: Diagnosis not present

## 2020-10-10 DIAGNOSIS — K649 Unspecified hemorrhoids: Secondary | ICD-10-CM | POA: Diagnosis not present

## 2020-10-10 DIAGNOSIS — K648 Other hemorrhoids: Secondary | ICD-10-CM | POA: Diagnosis not present

## 2020-10-10 DIAGNOSIS — Z980 Intestinal bypass and anastomosis status: Secondary | ICD-10-CM | POA: Diagnosis not present

## 2020-10-10 DIAGNOSIS — Z86010 Personal history of colonic polyps: Secondary | ICD-10-CM | POA: Diagnosis not present

## 2020-10-10 DIAGNOSIS — D120 Benign neoplasm of cecum: Secondary | ICD-10-CM | POA: Diagnosis not present

## 2020-10-10 DIAGNOSIS — K289 Gastrojejunal ulcer, unspecified as acute or chronic, without hemorrhage or perforation: Secondary | ICD-10-CM | POA: Diagnosis not present

## 2020-10-10 DIAGNOSIS — Z8711 Personal history of peptic ulcer disease: Secondary | ICD-10-CM | POA: Diagnosis not present

## 2020-10-10 DIAGNOSIS — Z9889 Other specified postprocedural states: Secondary | ICD-10-CM | POA: Diagnosis not present

## 2020-10-10 DIAGNOSIS — Z1211 Encounter for screening for malignant neoplasm of colon: Secondary | ICD-10-CM | POA: Diagnosis not present

## 2020-11-26 DIAGNOSIS — H5213 Myopia, bilateral: Secondary | ICD-10-CM | POA: Diagnosis not present

## 2020-11-26 DIAGNOSIS — H0288A Meibomian gland dysfunction right eye, upper and lower eyelids: Secondary | ICD-10-CM | POA: Diagnosis not present

## 2020-11-26 DIAGNOSIS — H524 Presbyopia: Secondary | ICD-10-CM | POA: Diagnosis not present

## 2020-11-26 DIAGNOSIS — H1013 Acute atopic conjunctivitis, bilateral: Secondary | ICD-10-CM | POA: Diagnosis not present

## 2020-11-26 DIAGNOSIS — H52203 Unspecified astigmatism, bilateral: Secondary | ICD-10-CM | POA: Diagnosis not present

## 2020-11-26 DIAGNOSIS — H04123 Dry eye syndrome of bilateral lacrimal glands: Secondary | ICD-10-CM | POA: Diagnosis not present

## 2020-11-26 DIAGNOSIS — H0288B Meibomian gland dysfunction left eye, upper and lower eyelids: Secondary | ICD-10-CM | POA: Diagnosis not present

## 2020-12-21 DIAGNOSIS — M810 Age-related osteoporosis without current pathological fracture: Secondary | ICD-10-CM | POA: Diagnosis not present

## 2020-12-21 DIAGNOSIS — Z1231 Encounter for screening mammogram for malignant neoplasm of breast: Secondary | ICD-10-CM | POA: Diagnosis not present

## 2020-12-21 DIAGNOSIS — F411 Generalized anxiety disorder: Secondary | ICD-10-CM | POA: Diagnosis not present

## 2020-12-21 DIAGNOSIS — E441 Mild protein-calorie malnutrition: Secondary | ICD-10-CM | POA: Diagnosis not present

## 2020-12-21 DIAGNOSIS — E559 Vitamin D deficiency, unspecified: Secondary | ICD-10-CM | POA: Diagnosis not present

## 2020-12-21 DIAGNOSIS — Z0000 Encounter for general adult medical examination without abnormal findings: Secondary | ICD-10-CM | POA: Diagnosis not present

## 2020-12-21 DIAGNOSIS — F5101 Primary insomnia: Secondary | ICD-10-CM | POA: Diagnosis not present

## 2021-01-03 DIAGNOSIS — E441 Mild protein-calorie malnutrition: Secondary | ICD-10-CM | POA: Diagnosis not present

## 2021-01-03 DIAGNOSIS — Z13220 Encounter for screening for lipoid disorders: Secondary | ICD-10-CM | POA: Diagnosis not present

## 2021-01-03 DIAGNOSIS — M810 Age-related osteoporosis without current pathological fracture: Secondary | ICD-10-CM | POA: Diagnosis not present

## 2021-01-03 DIAGNOSIS — Z0000 Encounter for general adult medical examination without abnormal findings: Secondary | ICD-10-CM | POA: Diagnosis not present

## 2021-01-03 DIAGNOSIS — F411 Generalized anxiety disorder: Secondary | ICD-10-CM | POA: Diagnosis not present

## 2021-01-03 DIAGNOSIS — Z1329 Encounter for screening for other suspected endocrine disorder: Secondary | ICD-10-CM | POA: Diagnosis not present

## 2021-01-03 DIAGNOSIS — E559 Vitamin D deficiency, unspecified: Secondary | ICD-10-CM | POA: Diagnosis not present

## 2021-01-03 DIAGNOSIS — Z79899 Other long term (current) drug therapy: Secondary | ICD-10-CM | POA: Diagnosis not present

## 2021-01-03 DIAGNOSIS — F5101 Primary insomnia: Secondary | ICD-10-CM | POA: Diagnosis not present

## 2021-03-06 DIAGNOSIS — N952 Postmenopausal atrophic vaginitis: Secondary | ICD-10-CM | POA: Diagnosis not present

## 2021-03-06 DIAGNOSIS — K219 Gastro-esophageal reflux disease without esophagitis: Secondary | ICD-10-CM | POA: Diagnosis not present

## 2021-03-06 DIAGNOSIS — F411 Generalized anxiety disorder: Secondary | ICD-10-CM | POA: Diagnosis not present

## 2021-03-06 DIAGNOSIS — H6123 Impacted cerumen, bilateral: Secondary | ICD-10-CM | POA: Diagnosis not present
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
## 2021-06-06 DIAGNOSIS — H2513 Age-related nuclear cataract, bilateral: Secondary | ICD-10-CM | POA: Diagnosis not present

## 2021-06-06 DIAGNOSIS — H04123 Dry eye syndrome of bilateral lacrimal glands: Secondary | ICD-10-CM | POA: Diagnosis not present

## 2021-06-06 DIAGNOSIS — H52203 Unspecified astigmatism, bilateral: Secondary | ICD-10-CM | POA: Diagnosis not present

## 2021-06-06 DIAGNOSIS — H43813 Vitreous degeneration, bilateral: Secondary | ICD-10-CM | POA: Diagnosis not present

## 2021-06-06 DIAGNOSIS — H5213 Myopia, bilateral: Secondary | ICD-10-CM | POA: Diagnosis not present

## 2021-06-06 DIAGNOSIS — H0100A Unspecified blepharitis right eye, upper and lower eyelids: Secondary | ICD-10-CM | POA: Diagnosis not present

## 2021-06-06 DIAGNOSIS — H0100B Unspecified blepharitis left eye, upper and lower eyelids: Secondary | ICD-10-CM | POA: Diagnosis not present

## 2021-06-06 DIAGNOSIS — H524 Presbyopia: Secondary | ICD-10-CM | POA: Diagnosis not present

## 2022-08-14 ENCOUNTER — Other Ambulatory Visit: Payer: Self-pay

## 2022-08-14 DIAGNOSIS — J329 Chronic sinusitis, unspecified: Secondary | ICD-10-CM

## 2022-08-15 ENCOUNTER — Other Ambulatory Visit: Payer: Self-pay | Admitting: Otolaryngology

## 2022-08-15 DIAGNOSIS — J329 Chronic sinusitis, unspecified: Secondary | ICD-10-CM

## 2022-09-16 ENCOUNTER — Other Ambulatory Visit: Payer: Medicare Other

## 2022-10-21 ENCOUNTER — Other Ambulatory Visit: Payer: Self-pay

## 2024-03-08 ENCOUNTER — Encounter (INDEPENDENT_AMBULATORY_CARE_PROVIDER_SITE_OTHER): Payer: Self-pay | Admitting: Otolaryngology

## 2024-03-08 ENCOUNTER — Ambulatory Visit (INDEPENDENT_AMBULATORY_CARE_PROVIDER_SITE_OTHER): Payer: Self-pay | Admitting: Otolaryngology

## 2024-03-08 VITALS — BP 122/69 | HR 71

## 2024-03-08 DIAGNOSIS — R0981 Nasal congestion: Secondary | ICD-10-CM | POA: Diagnosis not present

## 2024-03-08 DIAGNOSIS — H7292 Unspecified perforation of tympanic membrane, left ear: Secondary | ICD-10-CM

## 2024-03-08 DIAGNOSIS — H6123 Impacted cerumen, bilateral: Secondary | ICD-10-CM | POA: Diagnosis not present

## 2024-03-08 DIAGNOSIS — J310 Chronic rhinitis: Secondary | ICD-10-CM

## 2024-03-08 DIAGNOSIS — J324 Chronic pansinusitis: Secondary | ICD-10-CM

## 2024-03-08 DIAGNOSIS — J343 Hypertrophy of nasal turbinates: Secondary | ICD-10-CM

## 2024-03-08 DIAGNOSIS — H7202 Central perforation of tympanic membrane, left ear: Secondary | ICD-10-CM

## 2024-03-08 NOTE — Progress Notes (Unsigned)
 Patient ID: Belinda Nielsen, female   DOB: Jul 22, 1950, 73 y.o.   MRN: [id number]  Follow-up: Chronic rhinosinusitis, nasal congestion, recurrent cerumen impaction  HPI: The patient is a 73 year old female who returns today for her follow-up evaluation.  She was last seen in February 2024.  At that time, she was noted to have nasal mucosal congestion, bilateral inferior turbinate hypertrophy, and chronic rhinosinusitis.  She was treated with multiple courses of antibiotics and nasal saline irrigation.  The patient returns today complaining of persistent nasal congestion.  She denies any facial pain, fever, or visual change.   Anesthesia: None  Description: Risks, benefits, and alternatives of flexible endoscopy were explained to the patient.  Specific mention was made of the risk of throat numbness with difficulty swallowing, possible bleeding from the nose and mouth, and pain from the procedure.  The patient gave oral consent to proceed.  The flexible scope was inserted into the right nasal cavity.  Endoscopy of the interior nasal cavity, superior, inferior, and middle meatus was performed. The sphenoid-ethmoid recess was examined. Edematous mucosa was noted.  No polyp, mass, or lesion was appreciated.  Olfactory cleft was clear.  Nasopharynx was clear.  Turbinates were hypertrophied but without mass. The procedure was repeated on the contralateral side with similar findings.  The patient tolerated the procedure well.  Assessment:  1.  Chronic rhinitis with nasal mucosal congestion and bilateral inferior turbinate hypertrophy. 2.  Chronic rhinosinusitis.  No acute infection is noted today.   Plan:  1.  The nasal endoscopy findings are reviewed with the patient. 2.  Continue with nasal saline irrigation daily. 3.  The patient will return for reevaluation if she experiences more recurrent infection.

## 2024-03-09 DIAGNOSIS — J324 Chronic pansinusitis: Secondary | ICD-10-CM | POA: Insufficient documentation

## 2024-03-09 DIAGNOSIS — H6123 Impacted cerumen, bilateral: Secondary | ICD-10-CM | POA: Insufficient documentation

## 2024-03-09 DIAGNOSIS — H7202 Central perforation of tympanic membrane, left ear: Secondary | ICD-10-CM | POA: Insufficient documentation

## 2024-03-09 DIAGNOSIS — J343 Hypertrophy of nasal turbinates: Secondary | ICD-10-CM | POA: Insufficient documentation
